# Patient Record
Sex: Female | Born: 2000 | Hispanic: Yes | Marital: Single | Smoking: Never smoker
Health system: Southern US, Community
[De-identification: ages and names within clinical notes are randomized; demographics above are authoritative.]

## PROBLEM LIST (undated history)

## (undated) DIAGNOSIS — J45909 Unspecified asthma, uncomplicated: Secondary | ICD-10-CM

---

## 2019-02-04 ENCOUNTER — Emergency Department (HOSPITAL_COMMUNITY)
Admission: EM | Admit: 2019-02-04 | Discharge: 2019-02-04 | Disposition: A | Discharge status: Home / Self Care | Payer: BLUE CROSS/BLUE SHIELD | Attending: Emergency Medicine | Admitting: Emergency Medicine

## 2019-02-04 ENCOUNTER — Encounter (HOSPITAL_COMMUNITY): Payer: Self-pay | Admitting: *Deleted

## 2019-02-04 DIAGNOSIS — J111 Influenza due to unidentified influenza virus with other respiratory manifestations: Secondary | ICD-10-CM | POA: Insufficient documentation

## 2019-02-04 DIAGNOSIS — J45901 Unspecified asthma with (acute) exacerbation: Secondary | ICD-10-CM | POA: Insufficient documentation

## 2019-02-04 DIAGNOSIS — R0602 Shortness of breath: Secondary | ICD-10-CM | POA: Diagnosis present

## 2019-02-04 HISTORY — DX: Unspecified asthma, uncomplicated: J45.909

## 2019-02-04 MED ORDER — ALBUTEROL SULFATE (2.5 MG/3ML) 0.083% IN NEBU
5.0000 mg | INHALATION_SOLUTION | Freq: Once | RESPIRATORY_TRACT | Status: AC
  Administered 2019-02-04: 5 mg via RESPIRATORY_TRACT
  Filled 2019-02-04: qty 6

## 2019-02-04 MED ORDER — ALBUTEROL SULFATE (2.5 MG/3ML) 0.083% IN NEBU: 2.5000 mg | INHALATION_SOLUTION | Freq: Four times a day (QID) | RESPIRATORY_TRACT | 0 refills | Status: DC | PRN

## 2019-02-04 MED ORDER — PREDNISONE 20 MG PO TABS
60.0000 mg | ORAL_TABLET | Freq: Once | ORAL | Status: AC
  Administered 2019-02-04: 60 mg via ORAL
  Filled 2019-02-04: qty 3

## 2019-02-04 MED ORDER — ONDANSETRON HCL 4 MG PO TABS: 4.0000 mg | ORAL_TABLET | Freq: Three times a day (TID) | ORAL | 0 refills | Status: AC | PRN

## 2019-02-04 MED ORDER — PREDNISONE 50 MG PO TABS: 50.0000 mg | ORAL_TABLET | Freq: Every day | ORAL | 0 refills | Status: AC

## 2019-02-04 MED ORDER — ACETAMINOPHEN 500 MG PO TABS
1000.0000 mg | ORAL_TABLET | Freq: Once | ORAL | Status: AC
  Administered 2019-02-04: 1000 mg via ORAL
  Filled 2019-02-04: qty 2

## 2019-02-04 NOTE — Discharge Instructions (Addendum)
The flu is a virus, which means it gets treated symptomatically as your body clears the illness.  Take Tamiflu as prescribed.  This may cause a lot of nausea.  As such, you may use Zofran as needed for nausea. Take prednisone as prescribed. Use either your inhaler or nebulizer every 4 hours for the next 2 days.  After this, use as needed for wheezing, shortness of breath, or chest tightness. Use Tylenol and ibuprofen as needed for fever and body aches. Make sure you are staying well-hydrated water.  This is very important. Return to the emergency room immediately if you are having increased difficulty breathing not relieved with your inhaler.  Return with any new, worsening, concerning symptoms.

## 2019-02-04 NOTE — ED Provider Notes (Signed)
[facility]-EMERGENCY DEPT Provider Note   CSN: [id number] Arrival date & time: 02/04/19  1645    History   Chief Complaint Chief Complaint  Patient presents with  . Influenza  . Asthma    HPI Erica Collins is a 18 y.o. female presenting for evaluation of flu and asthma exacerbation.  Patient states last night around 4 PM she felt acutely poor.  She reports fever, generalized body aches, nasal congestion, and cough.  Patient went to a [facility] today, tested positive for flu A.  She was given Tamiflu, but she has not been able to fill it yet.  Upon arrival back to [facility], she reports increased shortness of breath and difficulty breathing.  Patient reports a history of asthma, and her symptoms did not improve with her inhaler.  As such, she came to the ER.  She reports one episode of emesis after taking DayQuil, no further nausea.  She has not taken anything else for her symptoms.  She denies ear pain, chest pain, current nausea, abdominal pain, urinary symptoms, abnormal bowel movements.  She has no medical problems other than asthma.  She did not get her flu shot this year.  She denies sick contacts, however is in school.  She lives in an apartment.  She denies recent travel.     HPI  Past Medical History:  Diagnosis Date  . Asthma     There are no active problems to display for this patient.   History reviewed. No pertinent surgical history.   OB History   No obstetric history on file.      Home Medications    Prior to Admission medications   Medication Sig Start Date End Date Taking? Authorizing Provider  albuterol (PROVENTIL) (2.5 MG/3ML) 0.083% nebulizer solution Take 3 mLs (2.5 mg total) by nebulization every 6 (six) hours as needed for wheezing or shortness of breath. 02/04/19   Iseah Plouff, PA-C  ondansetron (ZOFRAN) 4 MG tablet Take 1 tablet (4 mg total) by mouth every 8 (eight) hours as needed. 02/04/19   Neida Ellegood,  Chesnie Capell, PA-C  predniSONE (DELTASONE) 50 MG tablet Take 1 tablet (50 mg total) by mouth daily for 4 days. Starting 3/6 02/04/19 02/08/19  Karee Christopherson, PA-C    Family History No family history on file.  Social History Social History   Tobacco Use  . Smoking status: Never Smoker  . Smokeless tobacco: Never Used  Substance Use Topics  . Alcohol use: Not Currently  . Drug use: Not on file     Allergies   Patient has no allergy information on record.   Review of Systems Review of Systems   Physical Exam Updated Vital Signs BP 99/80   Pulse (!) 108   Temp (!) 100.2 F (39.5 C) (Oral)   Resp 20   Ht 5\' 6"  (1.676 m)   Wt 63 kg   LMP 01/21/2019   SpO2 99%   BMI 22.44 kg/m   Physical Exam Vitals signs and nursing note reviewed.  Constitutional:      General: She is not in acute distress.    Appearance: She is well-developed.     Comments: Appears nontoxic  HENT:     Head: Normocephalic and atraumatic.     Comments: OP mildly erythematous without tonsillar swelling or exudate.  Uvula midline with equal palate rise.  TMs nonerythematous nonbulging bilaterally.  Nasal congestion noted.    Right Ear: Tympanic membrane, ear canal and external ear normal.  Left Ear: Tympanic membrane, ear canal and external ear normal.     Nose: Mucosal edema present.     Right Sinus: No maxillary sinus tenderness or frontal sinus tenderness.     Left Sinus: No maxillary sinus tenderness or frontal sinus tenderness.     Mouth/Throat:     Pharynx: Uvula midline. Posterior oropharyngeal erythema present.     Tonsils: No tonsillar exudate.  Eyes:     Conjunctiva/sclera: Conjunctivae normal.     Pupils: Pupils are equal, round, and reactive to light.  Neck:     Musculoskeletal: Normal range of motion.  Cardiovascular:     Rate and Rhythm: Regular rhythm. Tachycardia present.     Pulses: Normal pulses.     Comments: Mildly tachycardic around 115.  This is after breathing  treatment. Pulmonary:     Effort: Pulmonary effort is normal.     Breath sounds: Normal breath sounds. No decreased breath sounds, wheezing, rhonchi or rales.     Comments: Speaking in full sentences.  Clear lung sounds in all fields.  Cough noted throughout exam. Abdominal:     General: There is no distension.     Palpations: Abdomen is soft. There is no mass.     Tenderness: There is no abdominal tenderness. There is no guarding or rebound.  Musculoskeletal: Normal range of motion.  Lymphadenopathy:     Cervical: No cervical adenopathy.  Skin:    General: Skin is warm.     Capillary Refill: Capillary refill takes less than 2 seconds.  Neurological:     Mental Status: She is alert and oriented to person, place, and time.      ED Treatments / Results  Labs (all labs ordered are listed, but only abnormal results are displayed) Labs Reviewed - No data to display  EKG None  Radiology No results found.  Procedures Procedures (including critical care time)  Medications Ordered in ED Medications  albuterol (PROVENTIL) (2.5 MG/3ML) 0.083% nebulizer solution 5 mg (5 mg Nebulization Given 02/04/19 1717)  acetaminophen (TYLENOL) tablet 1,000 mg (1,000 mg Oral Given 02/04/19 1820)  predniSONE (DELTASONE) tablet 60 mg (60 mg Oral Given 02/04/19 1824)     Initial Impression / Assessment and Plan / ED Course  I have reviewed the triage vital signs and the nursing notes.  Pertinent labs & imaging results that were available during my care of the patient were reviewed by me and considered in my medical decision making (see chart for details).        Patient presenting with increased difficulty breathing after being diagnosed with the flu today.  My physical exam was after patient had been given a breathing treatment.  Per triage note, patient with wheezing upon arrival.  This resolved with the breathing treatment.  Patient mildly febrile and mildly tachycardic on my exam, but appears  nontoxic.  Likely asthma exacerbation due to the flu.  Patient has a nebulizer at home, but no nebulizer medication.  Requesting refill.  Patient given prednisone while waiting in the ER.  Discussed findings with patient and mom.  Discussed continued symptomatic treatment at home, and importance of Tamiflu.  Discussed regular albuterol use, and low threshold for returning if symptoms worsen.  Upon discharge, patient found to be febrile and tachycardic.  Tachycardia likely due to the fever and breathing treatment.  Patient given a gram of Tylenol, offered for her to wait for symptom improvement versus discharge.  Patient would like to be discharged.  At this time, patient  appears safe for discharge. Strict return precautions given. Patient states she understands and agrees plan.  Final Clinical Impressions(s) / ED Diagnoses   Final diagnoses:  Influenza  Exacerbation of asthma, unspecified asthma severity, unspecified whether persistent    ED Discharge Orders         Ordered    predniSONE (DELTASONE) 50 MG tablet  Daily     02/04/19 1821    ondansetron (ZOFRAN) 4 MG tablet  Every 8 hours PRN     02/04/19 1821    albuterol (PROVENTIL) (2.5 MG/3ML) 0.083% nebulizer solution  Every 6 hours PRN     02/04/19 1843           Alveria ApleyCaccavale, Chibuikem Thang, PA-C 02/04/19 2022    Wynetta FinesMessick, Peter C, MD 02/04/19 2024

## 2019-02-04 NOTE — ED Notes (Signed)
Bed: WTR6 Expected date:  Expected time:  Means of arrival:  Comments: HOLD FOR TRIAGE 1

## 2019-02-04 NOTE — ED Triage Notes (Addendum)
Pt was diagnosed with flu this morning. Pt had fever and cough since this morning. Pt has hx of asthma. Pt has prescription for Tamiflu. Pt states breathing is feeling worse. Pt tried her inhaler without relief.

## 2020-11-14 ENCOUNTER — Encounter (HOSPITAL_COMMUNITY): Payer: Self-pay

## 2020-11-14 ENCOUNTER — Emergency Department (HOSPITAL_COMMUNITY)
Admission: EM | Admit: 2020-11-14 | Discharge: 2020-11-14 | Disposition: A | Discharge status: Home / Self Care | Payer: BC Managed Care – PPO | Attending: Emergency Medicine | Admitting: Emergency Medicine

## 2020-11-14 ENCOUNTER — Emergency Department (HOSPITAL_COMMUNITY): Payer: BC Managed Care – PPO

## 2020-11-14 DIAGNOSIS — J4520 Mild intermittent asthma, uncomplicated: Secondary | ICD-10-CM | POA: Diagnosis present

## 2020-11-14 DIAGNOSIS — Z20822 Contact with and (suspected) exposure to covid-19: Secondary | ICD-10-CM | POA: Diagnosis not present

## 2020-11-14 DIAGNOSIS — J069 Acute upper respiratory infection, unspecified: Secondary | ICD-10-CM | POA: Diagnosis not present

## 2020-11-14 DIAGNOSIS — R0981 Nasal congestion: Secondary | ICD-10-CM | POA: Diagnosis not present

## 2020-11-14 DIAGNOSIS — Z79899 Other long term (current) drug therapy: Secondary | ICD-10-CM | POA: Diagnosis not present

## 2020-11-14 DIAGNOSIS — R0602 Shortness of breath: Secondary | ICD-10-CM | POA: Diagnosis not present

## 2020-11-14 DIAGNOSIS — R059 Cough, unspecified: Secondary | ICD-10-CM | POA: Diagnosis not present

## 2020-11-14 LAB — RESP PANEL BY RT-PCR (RSV, FLU A&B, COVID)  RVPGX2
Influenza A by PCR: NEGATIVE
Influenza B by PCR: NEGATIVE
Resp Syncytial Virus by PCR: NEGATIVE
SARS Coronavirus 2 by RT PCR: NEGATIVE

## 2020-11-14 MED ORDER — ALBUTEROL SULFATE (2.5 MG/3ML) 0.083% IN NEBU: 2.5000 mg | INHALATION_SOLUTION | Freq: Four times a day (QID) | RESPIRATORY_TRACT | 2 refills | Status: DC | PRN

## 2020-11-14 MED ORDER — AEROCHAMBER Z-STAT PLUS/MEDIUM MISC
1.0000 | Freq: Once | Status: AC
  Administered 2020-11-14: 1
  Filled 2020-11-14: qty 1

## 2020-11-14 MED ORDER — IPRATROPIUM-ALBUTEROL 0.5-2.5 (3) MG/3ML IN SOLN
3.0000 mL | RESPIRATORY_TRACT | Status: AC
  Administered 2020-11-14 (×3): 3 mL via RESPIRATORY_TRACT
  Filled 2020-11-14: qty 9

## 2020-11-14 MED ORDER — PREDNISONE 10 MG PO TABS: 40.0000 mg | ORAL_TABLET | Freq: Every day | ORAL | 0 refills | Status: AC

## 2020-11-14 MED ORDER — ALBUTEROL SULFATE HFA 108 (90 BASE) MCG/ACT IN AERS
4.0000 | INHALATION_SPRAY | Freq: Once | RESPIRATORY_TRACT | Status: AC
  Administered 2020-11-14: 4 via RESPIRATORY_TRACT
  Filled 2020-11-14: qty 6.7

## 2020-11-14 MED ORDER — PREDNISONE 20 MG PO TABS
40.0000 mg | ORAL_TABLET | Freq: Once | ORAL | Status: AC
  Administered 2020-11-14: 40 mg via ORAL
  Filled 2020-11-14: qty 2

## 2020-11-14 NOTE — ED Triage Notes (Signed)
Pt presents with c/o asthma and nasal congestion. Pt reports she feels like she has a cold. Pt reports she has been using her nebulizer with minimal relief. Pt alert and oriented, no distress noted.

## 2020-11-14 NOTE — ED Provider Notes (Signed)
[facility]-EMERGENCY DEPT Provider Note   CSN: [id number] Arrival date & time: 11/14/20  1021     History Chief Complaint  Patient presents with  . Nasal Congestion  . Asthma  . Cough    Erica Collins is a 19 y.o. female.   Asthma This is a recurrent problem. The current episode started 2 days ago. The problem occurs constantly. The problem has not changed since onset.Associated symptoms include shortness of breath. Pertinent negatives include no chest pain and no abdominal pain. Nothing aggravates the symptoms. Treatments tried: albuterol. The treatment provided mild relief.  Cough Associated symptoms: rhinorrhea and shortness of breath   Associated symptoms: no chest pain, no chills, no ear pain, no fever, no rash and no sore throat        Past Medical History:  Diagnosis Date  . Asthma     There are no problems to display for this patient.   History reviewed. No pertinent surgical history.   OB History   No obstetric history on file.     History reviewed. No pertinent family history.  Social History   Tobacco Use  . Smoking status: Never Smoker  . Smokeless tobacco: Never Used  Substance Use Topics  . Alcohol use: Not Currently    Home Medications Prior to Admission medications   Medication Sig Start Date End Date Taking? Authorizing Provider  albuterol (PROVENTIL) (2.5 MG/3ML) 0.083% nebulizer solution Take 3 mLs (2.5 mg total) by nebulization every 6 (six) hours as needed for wheezing or shortness of breath. 11/14/20   Sabino Donovan, MD  ondansetron (ZOFRAN) 4 MG tablet Take 1 tablet (4 mg total) by mouth every 8 (eight) hours as needed. 02/04/19   Caccavale, Sophia, PA-C  predniSONE (DELTASONE) 10 MG tablet Take 4 tablets (40 mg total) by mouth daily with breakfast for 4 days. 11/14/20 11/18/20  Sabino Donovan, MD    Allergies    Patient has no known allergies.  Review of Systems   Review of Systems  Constitutional:  Negative for chills and fever.  HENT: Positive for congestion and rhinorrhea. Negative for ear pain and sore throat.   Eyes: Negative for pain and visual disturbance.  Respiratory: Positive for cough, chest tightness and shortness of breath.   Cardiovascular: Negative for chest pain and palpitations.  Gastrointestinal: Negative for abdominal pain and vomiting.  Genitourinary: Negative for dysuria and hematuria.  Musculoskeletal: Negative for arthralgias and back pain.  Skin: Negative for color change and rash.  Neurological: Negative for seizures and syncope.  All other systems reviewed and are negative.   Physical Exam Updated Vital Signs BP 133/70 (BP Location: Left Arm)   Pulse 87   Temp 98.7 F (37.1 C) (Oral)   Resp 16   LMP 10/24/2020 (Approximate)   SpO2 98%   Physical Exam Vitals and nursing note reviewed. Exam conducted with a chaperone present.  Constitutional:      General: She is not in acute distress.    Appearance: Normal appearance.  HENT:     Head: Normocephalic and atraumatic.     Nose: No rhinorrhea.  Eyes:     General:        Right eye: No discharge.        Left eye: No discharge.     Conjunctiva/sclera: Conjunctivae normal.  Cardiovascular:     Rate and Rhythm: Normal rate and regular rhythm.  Pulmonary:     Effort: Pulmonary effort is normal. No respiratory distress.  Breath sounds: No stridor. Wheezing present.  Abdominal:     General: Abdomen is flat. There is no distension.     Palpations: Abdomen is soft.  Musculoskeletal:        General: No tenderness or signs of injury.  Skin:    General: Skin is warm and dry.  Neurological:     General: No focal deficit present.     Mental Status: She is alert. Mental status is at baseline.     Motor: No weakness.  Psychiatric:        Mood and Affect: Mood normal.        Behavior: Behavior normal.     ED Results / Procedures / Treatments   Labs (all labs ordered are listed, but only abnormal  results are displayed) Labs Reviewed  RESP PANEL BY RT-PCR (RSV, FLU A&B, COVID)  [id number]    EKG None  Radiology DG Chest 2 View  Result Date: 11/14/2020 CLINICAL DATA:  Cough. Additional provided: Patient reports history of asthma, nasal congestion. EXAM: CHEST - 2 VIEW COMPARISON:  No pertinent prior exams available for comparison. FINDINGS: Heart size within normal limits. No appreciable airspace consolidation. No evidence of pleural effusion or pneumothorax. No acute bony abnormality identified. IMPRESSION: No evidence of active cardiopulmonary disease. Electronically Signed   By: Jackey Loge DO   On: 11/14/2020 12:40    Procedures Procedures (including critical care time)  Medications Ordered in ED Medications  albuterol (VENTOLIN HFA) 108 (90 Base) MCG/ACT inhaler 4 puff (has no administration in time range)  aerochamber Z-Stat Plus/medium 1 each (has no administration in time range)  ipratropium-albuterol (DUONEB) 0.5-2.5 (3) MG/3ML nebulizer solution 3 mL (3 mLs Nebulization Given 11/14/20 1353)  predniSONE (DELTASONE) tablet 40 mg (40 mg Oral Given 11/14/20 1328)    ED Course  I have reviewed the triage vital signs and the nursing notes.  Pertinent labs & imaging results that were available during my care of the patient were reviewed by me and considered in my medical decision making (see chart for details).    MDM Rules/Calculators/A&P                          Patient with history of asthma comes in with chest tightness similar to previous asthma exacerbations likely in the setting of URI.  Hemodynamically stable.  Labs reviewed by me show no Covid or other viral illness that we test for, chest x-ray reviewed by radiology myself shows no acute cardiopulmonary pathology.  Patient will get breathing treatment steroids.  Patient has complete resolution of symptoms, has clear lungs on reauscultation, safe for discharge home outpatient follow-up, MDI given refill of  albuterol given prednisone prescription given return precautions discussed Final Clinical Impression(s) / ED Diagnoses Final diagnoses:  Upper respiratory tract infection, unspecified type  Mild intermittent asthma, unspecified whether complicated    Rx / DC Orders ED Discharge Orders         Ordered    predniSONE (DELTASONE) 10 MG tablet  Daily with breakfast        11/14/20 1423    albuterol (PROVENTIL) (2.5 MG/3ML) 0.083% nebulizer solution  Every 6 hours PRN        11/14/20 1423           Sabino Donovan, MD 11/14/20 1423

## 2021-08-01 ENCOUNTER — Encounter (HOSPITAL_COMMUNITY): Payer: Self-pay | Admitting: *Deleted

## 2021-08-01 ENCOUNTER — Emergency Department (HOSPITAL_COMMUNITY)
Admission: EM | Admit: 2021-08-01 | Discharge: 2021-08-01 | Disposition: A | Discharge status: Home / Self Care | Payer: BC Managed Care – PPO | Attending: Emergency Medicine | Admitting: Emergency Medicine

## 2021-08-01 ENCOUNTER — Other Ambulatory Visit: Payer: Self-pay

## 2021-08-01 ENCOUNTER — Emergency Department (HOSPITAL_COMMUNITY): Payer: BC Managed Care – PPO

## 2021-08-01 DIAGNOSIS — J45901 Unspecified asthma with (acute) exacerbation: Secondary | ICD-10-CM | POA: Insufficient documentation

## 2021-08-01 DIAGNOSIS — R0602 Shortness of breath: Secondary | ICD-10-CM | POA: Diagnosis present

## 2021-08-01 DIAGNOSIS — Z20822 Contact with and (suspected) exposure to covid-19: Secondary | ICD-10-CM | POA: Diagnosis not present

## 2021-08-01 LAB — RESP PANEL BY RT-PCR (FLU A&B, COVID) ARPGX2
Influenza A by PCR: NEGATIVE
Influenza B by PCR: NEGATIVE
SARS Coronavirus 2 by RT PCR: NEGATIVE

## 2021-08-01 MED ORDER — ALBUTEROL SULFATE (2.5 MG/3ML) 0.083% IN NEBU
5.0000 mg | INHALATION_SOLUTION | Freq: Once | RESPIRATORY_TRACT | Status: AC
  Administered 2021-08-01: 5 mg via RESPIRATORY_TRACT
  Filled 2021-08-01: qty 6

## 2021-08-01 MED ORDER — PREDNISONE 20 MG PO TABS
20.0000 mg | ORAL_TABLET | Freq: Once | ORAL | Status: AC
  Administered 2021-08-01: 20 mg via ORAL
  Filled 2021-08-01: qty 1

## 2021-08-01 MED ORDER — ALBUTEROL SULFATE (2.5 MG/3ML) 0.083% IN NEBU: 2.5000 mg | INHALATION_SOLUTION | Freq: Four times a day (QID) | RESPIRATORY_TRACT | 2 refills | Status: AC | PRN

## 2021-08-01 MED ORDER — ALBUTEROL SULFATE (2.5 MG/3ML) 0.083% IN NEBU: 2.5000 mg | INHALATION_SOLUTION | Freq: Four times a day (QID) | RESPIRATORY_TRACT | 2 refills | Status: DC | PRN

## 2021-08-01 MED ORDER — PREDNISONE 10 MG PO TABS: 20.0000 mg | ORAL_TABLET | Freq: Every day | ORAL | 0 refills | Status: DC

## 2021-08-01 NOTE — ED Provider Notes (Signed)
[facility]-EMERGENCY DEPT Provider Note   CSN: [id number] Arrival date & time: 08/01/21  1400     History Chief Complaint  Patient presents with   Shortness of Breath    Erica Collins is a 20 y.o. female.  HPI  20 year old female past medical history of asthma presents emergency department with shortness of breath and wheezing.  Patient states about a week ago she was treated for sinusitis with antibiotic and steroids.  She improved however a couple days ago started having worsening wheezing not resolved with her albuterol inhaler at home.  Denies any fevers.  No ongoing chest pain or lower extremity swelling.  Past Medical History:  Diagnosis Date   Asthma     There are no problems to display for this patient.   History reviewed. No pertinent surgical history.   OB History   No obstetric history on file.     No family history on file.  Social History   Tobacco Use   Smoking status: Never   Smokeless tobacco: Never  Substance Use Topics   Alcohol use: Not Currently    Home Medications Prior to Admission medications   Medication Sig Start Date End Date Taking? Authorizing Provider  albuterol (PROVENTIL) (2.5 MG/3ML) 0.083% nebulizer solution Take 3 mLs (2.5 mg total) by nebulization every 6 (six) hours as needed for wheezing or shortness of breath. 11/14/20   Sabino Donovan, MD  ondansetron (ZOFRAN) 4 MG tablet Take 1 tablet (4 mg total) by mouth every 8 (eight) hours as needed. 02/04/19   Caccavale, Sophia, PA-C    Allergies    Patient has no known allergies.  Review of Systems   Review of Systems  Constitutional:  Negative for chills and fever.  HENT:  Negative for congestion.   Eyes:  Negative for visual disturbance.  Respiratory:  Positive for shortness of breath and wheezing.   Cardiovascular:  Negative for chest pain, palpitations and leg swelling.  Gastrointestinal:  Negative for abdominal pain, diarrhea and vomiting.   Genitourinary:  Negative for dysuria.  Skin:  Negative for rash.  Neurological:  Negative for headaches.   Physical Exam Updated Vital Signs BP (!) 132/92   Pulse 71   Temp 98.2 F (36.8 C) (Oral)   Resp 18   LMP 07/06/2021   SpO2 99%   Physical Exam Vitals and nursing note reviewed.  Constitutional:      General: She is not in acute distress.    Appearance: Normal appearance. She is not diaphoretic.  HENT:     Head: Normocephalic.     Mouth/Throat:     Mouth: Mucous membranes are moist.  Cardiovascular:     Rate and Rhythm: Normal rate.  Pulmonary:     Effort: Pulmonary effort is normal. No tachypnea, accessory muscle usage or respiratory distress.     Breath sounds: Wheezing present. No rales.  Abdominal:     Palpations: Abdomen is soft.     Tenderness: There is no abdominal tenderness.  Skin:    General: Skin is warm.  Neurological:     Mental Status: She is alert and oriented to person, place, and time. Mental status is at baseline.  Psychiatric:        Mood and Affect: Mood normal.    ED Results / Procedures / Treatments   Labs (all labs ordered are listed, but only abnormal results are displayed) Labs Reviewed  RESP PANEL BY RT-PCR (FLU A&B, COVID) [id number]    EKG None  Radiology DG Chest 2 View  Result Date: 08/01/2021 CLINICAL DATA:  Shortness of breath EXAM: CHEST - 2 VIEW COMPARISON:  December 2021 FINDINGS: The heart size and mediastinal contours are within normal limits. Both lungs are clear. No pleural effusion. The visualized skeletal structures are unremarkable. IMPRESSION: No active cardiopulmonary disease. Electronically Signed   By: Guadlupe Spanish M.D.   On: 08/01/2021 15:02    Procedures Procedures   Medications Ordered in ED Medications  albuterol (PROVENTIL) (2.5 MG/3ML) 0.083% nebulizer solution 5 mg (has no administration in time range)  predniSONE (DELTASONE) tablet 20 mg (has no administration in time range)    ED Course  I have  reviewed the triage vital signs and the nursing notes.  Pertinent labs & imaging results that were available during my care of the patient were reviewed by me and considered in my medical decision making (see chart for details).    MDM Rules/Calculators/A&P                           20 year old female presents emergency department with shortness of breath and wheezing.  Vitals are stable.  Diffuse wheezing bilaterally.  Flu and COVID-negative.  Improved with nebulizer and steroids.  On lung auscultation wheezing has resolved, patient feels significantly improved, has been ambulatory without any symptoms.  Low suspicion for PE, PERC negative.  Chest x-ray is negative for pneumonia or other acute findings.  We will treat as asthma exacerbation and discharge.  Patient at this time appears safe and stable for discharge and will be treated as an outpatient.  Discharge plan and strict return to ED precautions discussed, patient verbalizes understanding and agreement.  Final Clinical Impression(s) / ED Diagnoses Final diagnoses:  None    Rx / DC Orders ED Discharge Orders     None        Rozelle Logan, DO 08/01/21 2157

## 2021-08-01 NOTE — Discharge Instructions (Addendum)
You have been seen and discharged from the emergency department.  Use your albuterol nebulizer as prescribed.  Take steroids as directed.  Follow-up with your primary provider for reevaluation and further care. Take home medications as prescribed. If you have any worsening symptoms or further concerns for your health please return to an emergency department for further evaluation.

## 2021-08-01 NOTE — ED Provider Notes (Signed)
Emergency Medicine Provider Triage Evaluation Note  Erica Collins , a 20 y.o. female  was evaluated in triage.  Pt complains of sob. Asthma hx, recently completed prednisone and amoxicillin. Primary care wants CXR to evaluated walking PNA.  Review of Systems  Positive: SOB, chest tightness when albuterol wears off Negative: Fevers, chills  Physical Exam  BP (!) 130/104 (BP Location: Right Arm)   Pulse 90   Temp 98.8 F (37.1 C) (Oral)   Resp 18   LMP 07/06/2021   SpO2 99%  Gen:   Awake, no distress   Resp:  Normal effort  MSK:   Moves extremities without difficulty  Other:    Medical Decision Making  Medically screening exam initiated at 2:28 PM.  Appropriate orders placed.  Erica Collins was informed that the remainder of the evaluation will be completed by another provider, this initial triage assessment does not replace that evaluation, and the importance of remaining in the ED until their evaluation is complete.     Woodroe Chen 08/01/21 1430    Cheryll Cockayne, MD 08/06/21 [phone]

## 2021-08-01 NOTE — ED Triage Notes (Signed)
Pt sent here for chest x-ray for possible PNA. She was treated with amoxicillin and prednisone. Completed treatment, still has to use inhaler, feels short of breath and is coughing.

## 2021-08-21 ENCOUNTER — Emergency Department (HOSPITAL_COMMUNITY)
Admission: EM | Admit: 2021-08-21 | Discharge: 2021-08-21 | Disposition: A | Discharge status: Home / Self Care | Payer: BC Managed Care – PPO | Attending: Emergency Medicine | Admitting: Emergency Medicine

## 2021-08-21 ENCOUNTER — Encounter (HOSPITAL_COMMUNITY): Payer: Self-pay | Admitting: Emergency Medicine

## 2021-08-21 ENCOUNTER — Emergency Department (HOSPITAL_COMMUNITY): Payer: BC Managed Care – PPO

## 2021-08-21 DIAGNOSIS — R0602 Shortness of breath: Secondary | ICD-10-CM | POA: Diagnosis present

## 2021-08-21 DIAGNOSIS — J4541 Moderate persistent asthma with (acute) exacerbation: Secondary | ICD-10-CM | POA: Insufficient documentation

## 2021-08-21 DIAGNOSIS — Z7951 Long term (current) use of inhaled steroids: Secondary | ICD-10-CM | POA: Insufficient documentation

## 2021-08-21 LAB — CBC WITH DIFFERENTIAL/PLATELET
Abs Immature Granulocytes: 0.01 10*3/uL (ref 0.00–0.07)
Basophils Absolute: 0.1 10*3/uL (ref 0.0–0.1)
Basophils Relative: 2 %
Eosinophils Absolute: 1.1 10*3/uL — ABNORMAL HIGH (ref 0.0–0.5)
Eosinophils Relative: 14 %
HCT: 41.1 % (ref 36.0–46.0)
Hemoglobin: 13.7 g/dL (ref 12.0–15.0)
Immature Granulocytes: 0 %
Lymphocytes Relative: 25 %
Lymphs Abs: 2 10*3/uL (ref 0.7–4.0)
MCH: 30.2 pg (ref 26.0–34.0)
MCHC: 33.3 g/dL (ref 30.0–36.0)
MCV: 90.7 fL (ref 80.0–100.0)
Monocytes Absolute: 0.6 10*3/uL (ref 0.1–1.0)
Monocytes Relative: 7 %
Neutro Abs: 4.2 10*3/uL (ref 1.7–7.7)
Neutrophils Relative %: 52 %
Platelets: 372 10*3/uL (ref 150–400)
RBC: 4.53 MIL/uL (ref 3.87–5.11)
RDW: 12.8 % (ref 11.5–15.5)
WBC: 8.1 10*3/uL (ref 4.0–10.5)
nRBC: 0 % (ref 0.0–0.2)

## 2021-08-21 LAB — COMPREHENSIVE METABOLIC PANEL
ALT: 24 U/L (ref 0–44)
AST: 27 U/L (ref 15–41)
Albumin: 4.4 g/dL (ref 3.5–5.0)
Alkaline Phosphatase: 61 U/L (ref 38–126)
Anion gap: 8 (ref 5–15)
BUN: 12 mg/dL (ref 6–20)
CO2: 22 mmol/L (ref 22–32)
Calcium: 9.8 mg/dL (ref 8.9–10.3)
Chloride: 111 mmol/L (ref 98–111)
Creatinine, Ser: 0.76 mg/dL (ref 0.44–1.00)
GFR, Estimated: >60 mL/min (ref >60)
Glucose, Bld: 97 mg/dL (ref 70–99)
Potassium: 4.2 mmol/L (ref 3.5–5.1)
Sodium: 141 mmol/L (ref 135–145)
Total Bilirubin: 0.6 mg/dL (ref 0.3–1.2)
Total Protein: 7.7 g/dL (ref 6.5–8.1)

## 2021-08-21 LAB — D-DIMER, QUANTITATIVE: D-Dimer, Quant: 0.3 ug/mL-FEU (ref 0.00–0.50)

## 2021-08-21 LAB — TROPONIN I (HIGH SENSITIVITY): Troponin I (High Sensitivity): <2 ng/L (ref <18)

## 2021-08-21 MED ORDER — PULMICORT FLEXHALER 90 MCG/ACT IN AEPB: 2.0000 | INHALATION_SPRAY | Freq: Two times a day (BID) | RESPIRATORY_TRACT | 1 refills | Status: AC

## 2021-08-21 MED ORDER — PREDNISONE 20 MG PO TABS
60.0000 mg | ORAL_TABLET | Freq: Once | ORAL | Status: AC
  Administered 2021-08-21: 60 mg via ORAL
  Filled 2021-08-21: qty 3

## 2021-08-21 MED ORDER — PREDNISONE 20 MG PO TABS
ORAL_TABLET | ORAL | 0 refills | Status: DC
Start: 2021-08-21 — End: 2024-03-14

## 2021-08-21 MED ORDER — ALBUTEROL SULFATE HFA 108 (90 BASE) MCG/ACT IN AERS: 2.0000 | INHALATION_SPRAY | RESPIRATORY_TRACT | 1 refills | Status: DC | PRN

## 2021-08-21 MED ORDER — IPRATROPIUM-ALBUTEROL 0.5-2.5 (3) MG/3ML IN SOLN
3.0000 mL | Freq: Once | RESPIRATORY_TRACT | Status: AC
  Administered 2021-08-21: 3 mL via RESPIRATORY_TRACT
  Filled 2021-08-21: qty 3

## 2021-08-21 MED ORDER — ALBUTEROL SULFATE (5 MG/ML) 0.5% IN NEBU: 2.5000 mg | INHALATION_SOLUTION | Freq: Four times a day (QID) | RESPIRATORY_TRACT | 2 refills | Status: AC | PRN

## 2021-08-21 NOTE — ED Provider Notes (Signed)
Emergency Medicine Provider Triage Evaluation Note  Erica Collins , a 20 y.o. female  was evaluated in triage.  Pt complains of shortness of breath for a month.  She has a history of asthma however has never had a exacerbation last this long before.  She states that she had negative COVID test when this all started. Unable to apply PERC criteria as she takes hormone-based birth control.    Review of Systems  Positive: Shortness of breath, cough, wheezing Negative: fever  Physical Exam  BP 134/90 (BP Location: Right Arm)   Pulse 74   Temp 98.3 F (36.8 C) (Oral)   Resp 16   Ht 5\' 6"  (1.676 m)   Wt 77.1 kg   LMP 08/07/2021 (Approximate)   SpO2 98%   BMI 27.44 kg/m  Gen:   Awake, no distress   Resp:  Normal effort  MSK:   Moves extremities without difficulty  Other:  Diffuse bilateral wheezes.    Medical Decision Making  Medically screening exam initiated at 1:00 PM.  Appropriate orders placed.  Erica Collins was informed that the remainder of the evaluation will be completed by another provider, this initial triage assessment does not replace that evaluation, and the importance of remaining in the ED until their evaluation is complete.  As patient takes hormone-based birth control with ongoing shortness of breath that is outside of her usual asthma presentation we will obtain a D-dimer.  Additionally as I do not see she has had blood work drawn will obtain labs to check for electrolyte imbalances, anemia, and others.  Her chest x-ray is reassuring.  Given that she has been on multiple rounds of p.o. steroids without resolution I feel additional work-up is a warranted. She is not on any maintenance steroid inhales.       10/07/2021 08/21/21 1310    08/23/21, MD 08/21/21 [phone]

## 2021-08-21 NOTE — Discharge Instructions (Signed)
1.  Continue your prednisone starting tomorrow.  Your first dose was given in the emergency department. 2.  You may start taking Pulmicort for your baseline control of asthma.  Take 2 puffs twice daily. 3.  You have been given a prescription for an albuterol inhaler as well as solution for your nebulizer.  Use the albuterol every 4 hours as needed for wheezing and coughing.  Once your symptoms are improved, continue the Pulmicort and start using less of your albuterol. 4.  Get scheduled with a pulmonologist for evaluation of worsening asthma.  Return to emergency department immediately if your symptoms are worsening and not improving to treatment.

## 2021-08-21 NOTE — ED Triage Notes (Signed)
Pt states she has had SOB x 1/5 months. Hx of asthma but as soon as she gets off of her steroids she can't breathe again. Chest pain today. Took nebulizer. Alert and oriented.

## 2021-08-21 NOTE — ED Provider Notes (Signed)
[facility]-EMERGENCY DEPT Provider Note   CSN: [id number] Arrival date & time: 08/21/21  0847     History Chief Complaint  Patient presents with   Shortness of Breath    Erica Collins is a 20 y.o. female.  HPI Patient reports she has had asthma since childhood.  She reports typically her symptoms are controlled with an albuterol inhaler or nebulizer as needed.  Usually she does not have to use it more than a few times per month.  Lately however if she is having increasing frequency of attacks and having to use her albuterol several times a day every day.  This has been going on for about a month now.  Patient reports initially she was treated with a prednisone taper and symptoms improved but they have come back when the prednisone was finished and continue to persist and worsen.  Patient reports she has some coughing but has not had any fever and cough is nonproductive.  She was treated with amoxicillin at the beginning of this symptom elevation and there was no improvement.  She has not had any fever or chest pain.  No vomiting.  He has not had much nasal congestion sore throat or drainage.  No swelling of the legs or calf pain.  Patient reports that she thinks she may have environmental exposure to mold in her apartment.  It is being evaluated.  She does feel that her symptoms are improved when she is not in her apartment.    Past Medical History:  Diagnosis Date   Asthma     There are no problems to display for this patient.   History reviewed. No pertinent surgical history.   OB History   No obstetric history on file.     History reviewed. No pertinent family history.  Social History   Tobacco Use   Smoking status: Never   Smokeless tobacco: Never  Substance Use Topics   Alcohol use: Not Currently    Home Medications Prior to Admission medications   Medication Sig Start Date End Date Taking? Authorizing Provider  albuterol  (PROVENTIL) (5 MG/ML) 0.5% nebulizer solution Take 0.5 mLs (2.5 mg total) by nebulization every 6 (six) hours as needed for wheezing or shortness of breath. 08/21/21  Yes Arby Barrette, MD  albuterol (VENTOLIN HFA) 108 (90 Base) MCG/ACT inhaler Inhale 2 puffs into the lungs every 4 (four) hours as needed for wheezing or shortness of breath. 08/21/21  Yes Dale Ribeiro, Lebron Conners, MD  Budesonide (PULMICORT FLEXHALER) 90 MCG/ACT inhaler Inhale 2 puffs into the lungs 2 (two) times daily. 08/21/21  Yes Arby Barrette, MD  predniSONE (DELTASONE) 20 MG tablet 2 tabs po daily x 4 days 08/21/21  Yes Calyb Mcquarrie, Lebron Conners, MD  albuterol (PROVENTIL) (2.5 MG/3ML) 0.083% nebulizer solution Take 3 mLs (2.5 mg total) by nebulization every 6 (six) hours as needed for wheezing or shortness of breath. 08/01/21   Horton, Clabe Seal, DO  ondansetron (ZOFRAN) 4 MG tablet Take 1 tablet (4 mg total) by mouth every 8 (eight) hours as needed. 02/04/19   Caccavale, Sophia, PA-C  predniSONE (DELTASONE) 10 MG tablet Take 2 tablets (20 mg total) by mouth daily. Take 2 tablets (20 mg) by mouth daily for 5 days. Then take 1 tablet (10 mg) by mouth daily for 5 days. 08/01/21   Horton, Clabe Seal, DO    Allergies    Patient has no known allergies.  Review of Systems   Review of Systems 10 systems reviewed and negative  except as per HPI Physical Exam Updated Vital Signs BP (!) 150/93 (BP Location: Right Arm)   Pulse 85   Temp 97.8 F (36.6 C) (Oral)   Resp 16   Ht 5\' 6"  (1.676 m)   Wt 77.1 kg   LMP 08/07/2021 (Approximate)   SpO2 97%   BMI 27.44 kg/m   Physical Exam Constitutional:      General: She is not in acute distress.    Appearance: Normal appearance.  HENT:     Mouth/Throat:     Mouth: Mucous membranes are moist.     Pharynx: Oropharynx is clear.  Eyes:     Extraocular Movements: Extraocular movements intact.     Conjunctiva/sclera: Conjunctivae normal.  Cardiovascular:     Rate and Rhythm: Normal rate and regular rhythm.   Pulmonary:     Comments: No respiratory distress.  Patient does have diffuse wheezing bilaterally to the mid lung fields.  She does have cough with deep inspiration. Abdominal:     General: There is no distension.     Palpations: Abdomen is soft.     Tenderness: There is no abdominal tenderness. There is no guarding.  Musculoskeletal:        General: No swelling or tenderness. Normal range of motion.     Right lower leg: No edema.     Left lower leg: No edema.  Skin:    General: Skin is warm and dry.  Neurological:     General: No focal deficit present.     Mental Status: She is alert and oriented to person, place, and time.     Coordination: Coordination normal.  Psychiatric:        Mood and Affect: Mood normal.    ED Results / Procedures / Treatments   Labs (all labs ordered are listed, but only abnormal results are displayed) Labs Reviewed  CBC WITH DIFFERENTIAL/PLATELET - Abnormal; Notable for the following components:      Result Value   Eosinophils Absolute 1.1 (*)    All other components within normal limits  COMPREHENSIVE METABOLIC PANEL  D-DIMER, QUANTITATIVE  POC URINE PREG, ED  TROPONIN I (HIGH SENSITIVITY)  TROPONIN I (HIGH SENSITIVITY)    EKG EKG Interpretation  Date/Time:  Tuesday August 21 2021 08:57:54 EDT Ventricular Rate:  91 PR Interval:  136 QRS Duration: 79 QT Interval:  355 QTC Calculation: 437 R Axis:   32 Text Interpretation: Sinus rhythm normal, no old comparison Confirmed by [id number] [phone]) on 08/21/2021 5:03:53 PM  Radiology DG Chest 2 View  Result Date: 08/21/2021 CLINICAL DATA:  20 year old female with history of shortness of breath for 1 month. Chest pain today. EXAM: CHEST - 2 VIEW COMPARISON:  Chest x-ray 08/01/2021. FINDINGS: Lung volumes are normal. No consolidative airspace disease. No pleural effusions. No pneumothorax. No pulmonary nodule or mass noted. Pulmonary vasculature and the cardiomediastinal silhouette are  within normal limits. IMPRESSION: No radiographic evidence of acute cardiopulmonary disease. Electronically Signed   By: 08/03/2021 M.D.   On: 08/21/2021 09:35    Procedures Procedures   Medications Ordered in ED Medications  ipratropium-albuterol (DUONEB) 0.5-2.5 (3) MG/3ML nebulizer solution 3 mL (3 mLs Nebulization Given 08/21/21 1737)  predniSONE (DELTASONE) tablet 60 mg (60 mg Oral Given 08/21/21 1736)    ED Course  I have reviewed the triage vital signs and the nursing notes.  Pertinent labs & imaging results that were available during my care of the patient were reviewed by me and considered in my  medical decision making (see chart for details).    MDM Rules/Calculators/A&P                           Patient describes mild intermittent asthma since childhood.  She reports over the past several months that this has been worsening and she is having more consistent episodes of wheezing and coughing with shortness of breath.  She reports over the past month its become almost a daily problem.  Patient does not smoke.  She does not describe acute infectious symptoms.  She was empirically treated with amoxicillin at the onset of this event.  She opines she may have an ongoing environmental exposure in her apartment.  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  Patient is actively wheezing at time of examination.  At this time with over a month of recurrent and near daily symptoms requiring frequent albuterol use, will use a prednisone burst and start patient on daily maintenance therapy with Pulmicort.  Patient is advised to have close follow-up with pulmonology for further evaluation of worsening asthma as a young adult. Final Clinical Impression(s) / ED Diagnoses Final diagnoses:  Moderate persistent asthma with exacerbation    Rx / DC Orders ED Discharge Orders          Ordered    Budesonide (PULMICORT FLEXHALER) 90 MCG/ACT inhaler  2 times daily        08/21/21 1750    albuterol  (VENTOLIN HFA) 108 (90 Base) MCG/ACT inhaler  Every 4 hours PRN        08/21/21 1750    albuterol (PROVENTIL) (5 MG/ML) 0.5% nebulizer solution  Every 6 hours PRN        08/21/21 1750    predniSONE (DELTASONE) 20 MG tablet        08/21/21 1750             Arby Barrette, MD 08/21/21 1813

## 2021-12-04 IMAGING — CR DG CHEST 2V
2 series · 2 of 2 positions shown · non-contrast
Comparison: November 2020

CLINICAL DATA: Shortness of breath

EXAM:
CHEST - 2 VIEW

[w chest pa]
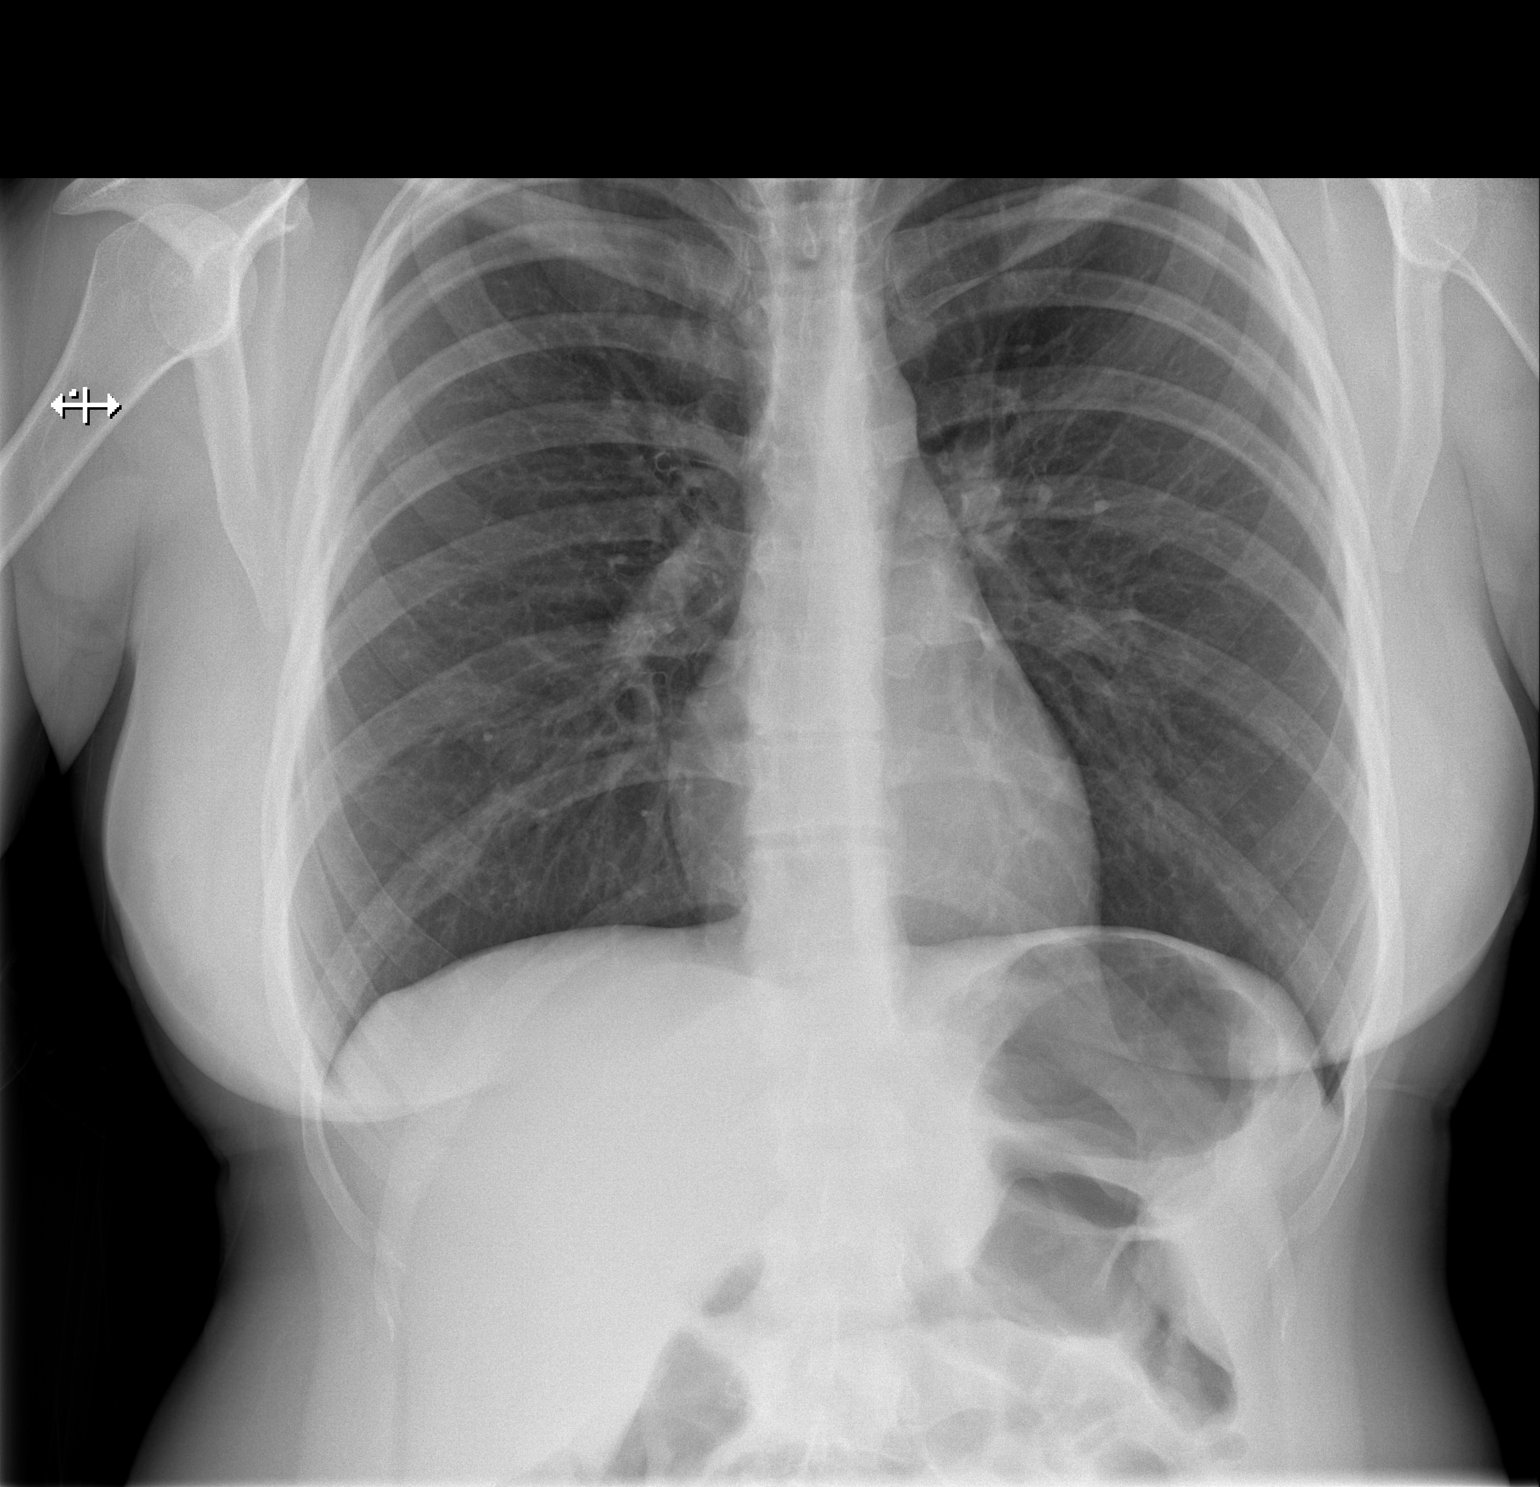

[w chest lat]
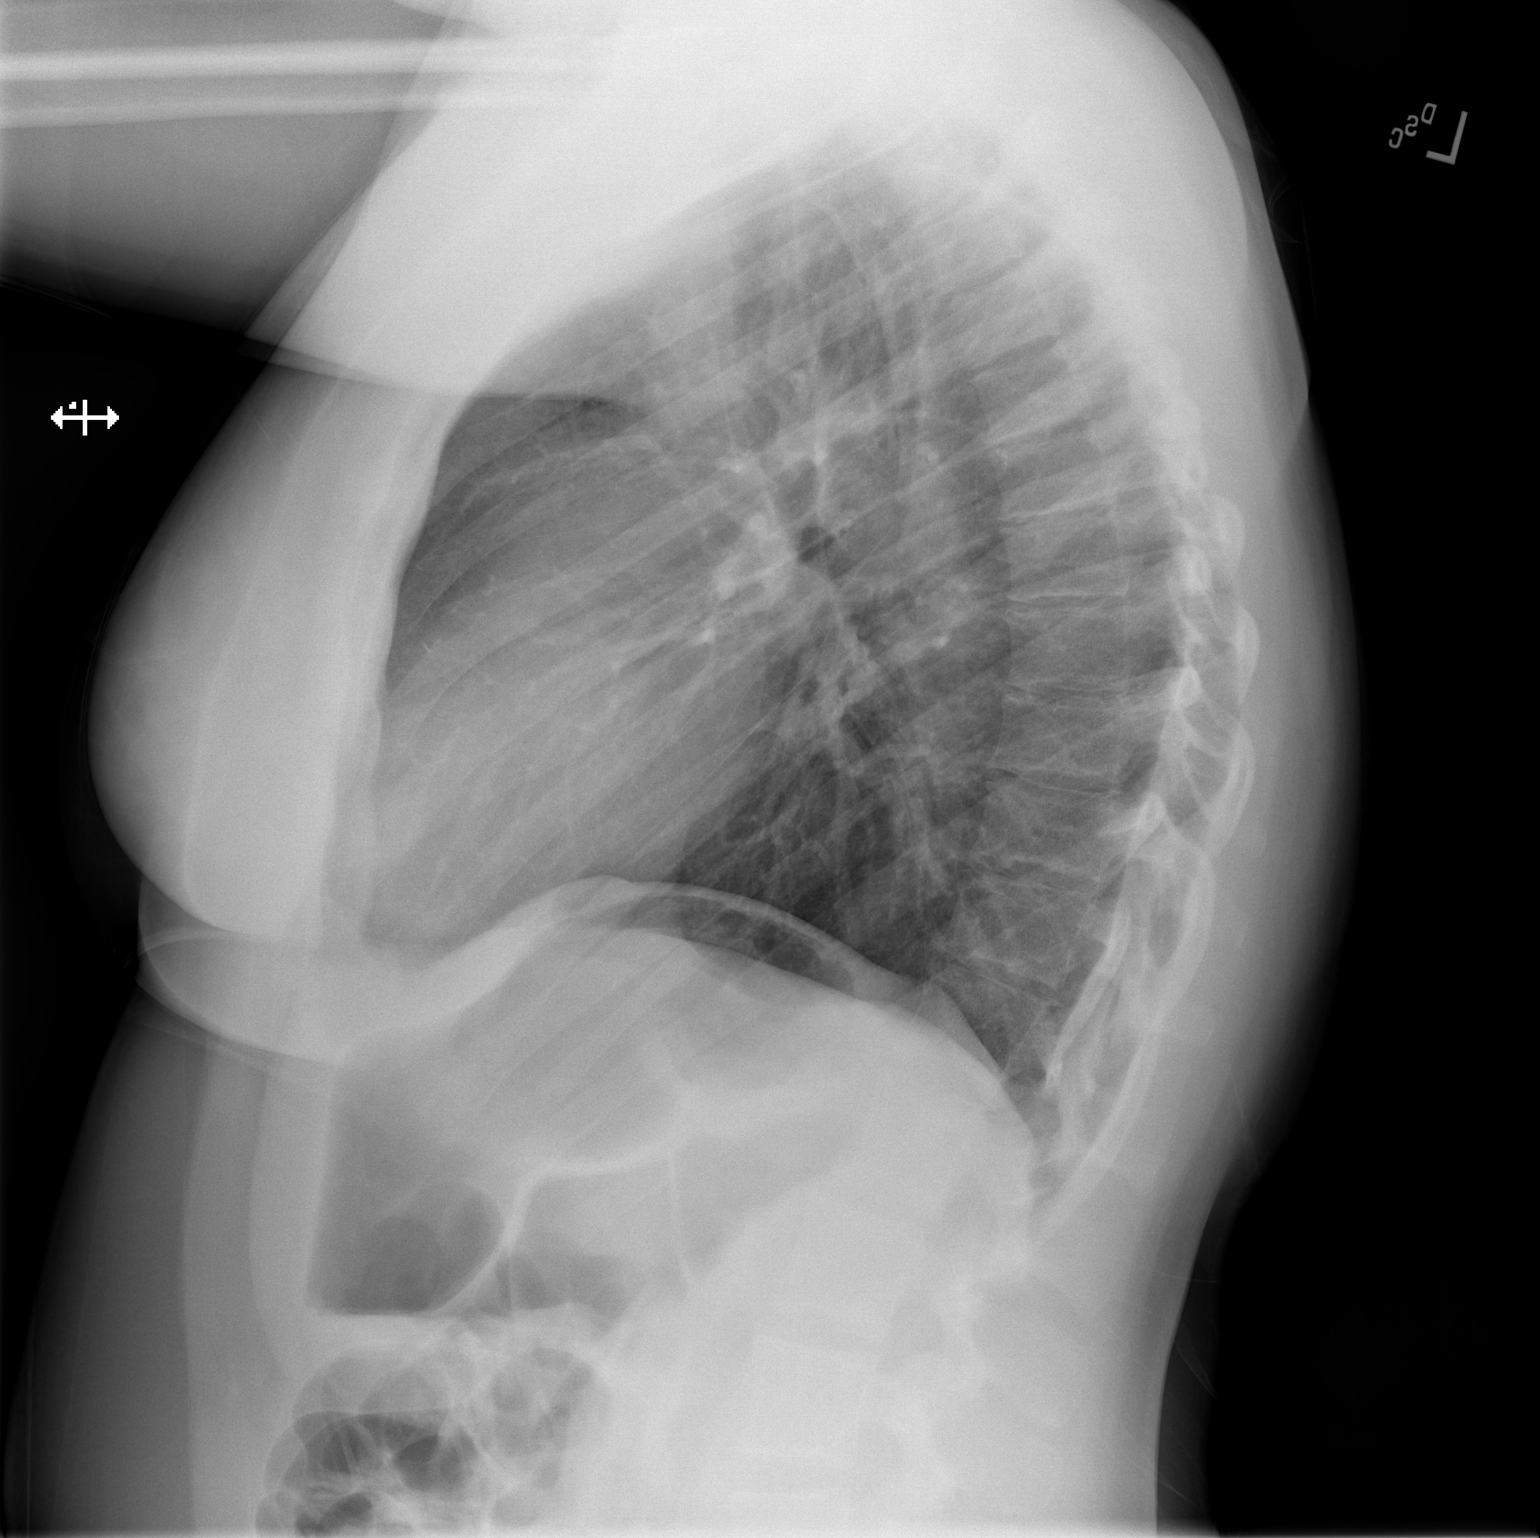

[2 of 2 positions shown; findings below may reference images not displayed]

FINDINGS: The heart size and mediastinal contours are within normal limits.
Both lungs are clear. No pleural effusion. The visualized skeletal
structures are unremarkable.
IMPRESSION: No active cardiopulmonary disease.

## 2022-01-04 ENCOUNTER — Emergency Department (HOSPITAL_COMMUNITY): Payer: BC Managed Care – PPO

## 2022-01-04 ENCOUNTER — Emergency Department (HOSPITAL_COMMUNITY)
Admission: EM | Admit: 2022-01-04 | Discharge: 2022-01-04 | Disposition: A | Discharge status: Home / Self Care | Payer: BC Managed Care – PPO | Attending: Emergency Medicine | Admitting: Emergency Medicine

## 2022-01-04 ENCOUNTER — Other Ambulatory Visit: Payer: Self-pay

## 2022-01-04 ENCOUNTER — Encounter (HOSPITAL_COMMUNITY): Payer: Self-pay

## 2022-01-04 DIAGNOSIS — Z79899 Other long term (current) drug therapy: Secondary | ICD-10-CM | POA: Diagnosis not present

## 2022-01-04 DIAGNOSIS — Z20822 Contact with and (suspected) exposure to covid-19: Secondary | ICD-10-CM | POA: Diagnosis not present

## 2022-01-04 DIAGNOSIS — J45901 Unspecified asthma with (acute) exacerbation: Secondary | ICD-10-CM | POA: Diagnosis not present

## 2022-01-04 DIAGNOSIS — Z7952 Long term (current) use of systemic steroids: Secondary | ICD-10-CM | POA: Insufficient documentation

## 2022-01-04 DIAGNOSIS — R079 Chest pain, unspecified: Secondary | ICD-10-CM | POA: Diagnosis present

## 2022-01-04 LAB — RESP PANEL BY RT-PCR (FLU A&B, COVID) ARPGX2
Influenza A by PCR: NEGATIVE
Influenza B by PCR: NEGATIVE
SARS Coronavirus 2 by RT PCR: NEGATIVE

## 2022-01-04 MED ORDER — IPRATROPIUM-ALBUTEROL 0.5-2.5 (3) MG/3ML IN SOLN
3.0000 mL | Freq: Once | RESPIRATORY_TRACT | Status: AC
  Administered 2022-01-04: 3 mL via RESPIRATORY_TRACT
  Filled 2022-01-04: qty 3

## 2022-01-04 MED ORDER — PREDNISONE 20 MG PO TABS
60.0000 mg | ORAL_TABLET | Freq: Once | ORAL | Status: AC
  Administered 2022-01-04: 60 mg via ORAL
  Filled 2022-01-04: qty 3

## 2022-01-04 MED ORDER — PREDNISONE 50 MG PO TABS: 50.0000 mg | ORAL_TABLET | Freq: Every day | ORAL | 0 refills | Status: AC

## 2022-01-04 NOTE — ED Provider Triage Note (Signed)
Emergency Medicine Provider Triage Evaluation Note  Erica Collins , a 21 y.o. female  was evaluated in triage.  Pt complains of sob, cough, wheezing that started tonight. Tried home nebs which did not improved sxs.  Review of Systems  Positive: Cough, wheezing, sob Negative: fever  Physical Exam  BP 129/76 (BP Location: Left Arm)    Pulse (!) 104    Temp 97.7 F (36.5 C) (Oral)    Resp (!) 24    Ht 5\' 6"  (1.676 m)    Wt 77.1 kg    SpO2 94%    BMI 27.44 kg/m  Gen:   Awake, no distress   Resp:  Normal effort  MSK:   Moves extremities without difficulty  Other:  Diffuse wheezing throughout, no rales  Medical Decision Making  Medically screening exam initiated at 4:49 AM.  Appropriate orders placed.  Erica Collins was informed that the remainder of the evaluation will be completed by another provider, this initial triage assessment does not replace that evaluation, and the importance of remaining in the ED until their evaluation is complete.     Rodney Booze, [place] 01/04/22 [phone]

## 2022-01-04 NOTE — Discharge Instructions (Signed)
Take prednisone as directed   Use your nebulizer treatments every 6 hours for the next 3 days  Please follow up with your primary care provider within 5-7 days for re-evaluation of your symptoms. If you do not have a primary care provider, information for a healthcare clinic has been provided for you to make arrangements for follow up care. Please return to the emergency department for any new or worsening symptoms.

## 2022-01-04 NOTE — ED Provider Notes (Signed)
[facility] EMERGENCY DEPARTMENT Provider Note   CSN: [id number] Arrival date & time: 01/04/22  0426     History  Chief Complaint  Patient presents with   Chest Pain    Erica Collins is a 21 y.o. female.  HPI  21 year old female presents to the emergency department today for evaluation of shortness of breath cough and wheezing that started last night.  Patient with history of asthma that has been flaring up more recently over the last 6 months.  She is required a course of steroids several times.  She is not had any recent fevers or other URI symptoms.  Home Medications Prior to Admission medications   Medication Sig Start Date End Date Taking? Authorizing Provider  predniSONE (DELTASONE) 50 MG tablet Take 1 tablet (50 mg total) by mouth daily for 5 days. 01/04/22 01/09/22 Yes Sunni Richardson S, PA-C  albuterol (PROVENTIL) (2.5 MG/3ML) 0.083% nebulizer solution Take 3 mLs (2.5 mg total) by nebulization every 6 (six) hours as needed for wheezing or shortness of breath. 08/01/21   Horton, Clabe Seal, DO  albuterol (PROVENTIL) (5 MG/ML) 0.5% nebulizer solution Take 0.5 mLs (2.5 mg total) by nebulization every 6 (six) hours as needed for wheezing or shortness of breath. 08/21/21   Arby Barrette, MD  albuterol (VENTOLIN HFA) 108 (90 Base) MCG/ACT inhaler Inhale 2 puffs into the lungs every 4 (four) hours as needed for wheezing or shortness of breath. 08/21/21   Arby Barrette, MD  Budesonide (PULMICORT FLEXHALER) 90 MCG/ACT inhaler Inhale 2 puffs into the lungs 2 (two) times daily. 08/21/21   Arby Barrette, MD  ondansetron (ZOFRAN) 4 MG tablet Take 1 tablet (4 mg total) by mouth every 8 (eight) hours as needed. 02/04/19   Caccavale, Sophia, PA-C  predniSONE (DELTASONE) 10 MG tablet Take 2 tablets (20 mg total) by mouth daily. Take 2 tablets (20 mg) by mouth daily for 5 days. Then take 1 tablet (10 mg) by mouth daily for 5 days. 08/01/21   Horton, Clabe Seal, DO   predniSONE (DELTASONE) 20 MG tablet 2 tabs po daily x 4 days 08/21/21   Arby Barrette, MD      Allergies    Patient has no known allergies.    Review of Systems   Review of Systems See HPI for pertinent positives or negatives.   Physical Exam Updated Vital Signs BP 133/86 (BP Location: Right Arm)    Pulse 92    Temp 98 F (36.7 C) (Oral)    Resp 18    Ht 5\' 6"  (1.676 m)    Wt 77.1 kg    SpO2 98%    BMI 27.44 kg/m  Physical Exam Vitals and nursing note reviewed.  Constitutional:      General: She is not in acute distress.    Appearance: She is well-developed.  HENT:     Head: Normocephalic and atraumatic.  Eyes:     Conjunctiva/sclera: Conjunctivae normal.  Cardiovascular:     Rate and Rhythm: Normal rate and regular rhythm.     Heart sounds: No murmur heard. Pulmonary:     Effort: Pulmonary effort is normal. No respiratory distress.     Breath sounds: Wheezing present.  Abdominal:     Palpations: Abdomen is soft.     Tenderness: There is no abdominal tenderness.  Musculoskeletal:        General: No swelling.     Cervical back: Neck supple.  Skin:    General: Skin is warm and  dry.     Capillary Refill: Capillary refill takes less than 2 seconds.  Neurological:     Mental Status: She is alert.  Psychiatric:        Mood and Affect: Mood normal.    ED Results / Procedures / Treatments   Labs (all labs ordered are listed, but only abnormal results are displayed) Labs Reviewed  RESP PANEL BY RT-PCR (FLU A&B, COVID) [id number]    EKG EKG Interpretation  Date/Time:  Friday January 04 2022 04:47:51 EST Ventricular Rate:  103 PR Interval:  144 QRS Duration: 70 QT Interval:  352 QTC Calculation: 461 R Axis:   12 Text Interpretation: Sinus tachycardia Otherwise normal ECG When compared with ECG of 21-Aug-2021 08:57, PREVIOUS ECG IS PRESENT Confirmed by Zadie Rhine ([id number]) on 01/04/2022 5:05:40 AM  Radiology DG Chest Portable 1 View  Result Date:  01/04/2022 CLINICAL DATA:  21 year old female woke with chest pain. Shortness of breath despite inhaler. EXAM: PORTABLE CHEST 1 VIEW COMPARISON:  Chest radiographs 08/21/2021 and earlier. FINDINGS: PA view at 0544 hours. Normal lung volumes and mediastinal contours. Visualized tracheal air column is within normal limits. Lung markings remain within normal limits. Both lungs appear clear. No pneumothorax or pleural effusion. Negative visible bowel gas and osseous structures. IMPRESSION: Negative.  No cardiopulmonary abnormality. Electronically Signed   By: Odessa Fleming M.D.   On: 01/04/2022 06:30    Procedures Procedures    Medications Ordered in ED Medications  ipratropium-albuterol (DUONEB) 0.5-2.5 (3) MG/3ML nebulizer solution 3 mL (3 mLs Nebulization Given 01/04/22 0455)  predniSONE (DELTASONE) tablet 60 mg (60 mg Oral Given 01/04/22 0454)    ED Course/ Medical Decision Making/ A&P                           Medical Decision Making Amount and/or Complexity of Data Reviewed Radiology: ordered.  Risk Prescription drug management.   21 year old female presents to the emergency department today for evaluation of shortness of breath, wheezing and cough that started last night.  History of asthma.  Symptoms feel similar.  No fevers.  Patient initially marginally tachycardic after doing several nebulizer treatments at home.  Her oxygen saturations are reassuring and she looks comfortable on my evaluation.  Her chest x-ray was reviewed and interpreted and does not show any evidence of pneumonia, pneumothorax or other abnormality at this time.  EKG shows sinus tach but is otherwise nonconcerning at this time.  She was given prednisone and a DuoNeb here in the ED and on reassessment she states she feels significantly improved.  Repeat lung exam reveals essentially resolved wheezing and patient is now having clear lung sounds.  We will treat as uncomplicated asthma exacerbation.  Doubt other emergent cause of  symptoms such as PE or other cardiopulmonary etiology.  Have discussed plan for follow-up with her PCP for further evaluation.  Advised on return precautions.  She voices understanding of the plan and reasons to return.  All questions answered.  Patient stable for discharge.   Final Clinical Impression(s) / ED Diagnoses Final diagnoses:  Exacerbation of asthma, unspecified asthma severity, unspecified whether persistent    Rx / DC Orders ED Discharge Orders          Ordered    predniSONE (DELTASONE) 50 MG tablet  Daily        01/04/22 0658              Karrie Meres, PA-C 01/04/22 [phone]  Zadie RhineWickline, Donald, MD 01/04/22 [phone]

## 2022-01-04 NOTE — ED Triage Notes (Signed)
Pt arrived POV from home c/o chest tightness that woke her up out of her sleep. Pt has a hx of asthma. Pt gave herself 2 albuterol neb treatments with no improvement.

## 2023-07-01 ENCOUNTER — Encounter (HOSPITAL_COMMUNITY): Payer: Self-pay

## 2023-07-01 ENCOUNTER — Emergency Department (HOSPITAL_COMMUNITY)
Admission: EM | Admit: 2023-07-01 | Discharge: 2023-07-01 | Discharge status: Against Medical Advice | Payer: BC Managed Care – PPO | Attending: Emergency Medicine | Admitting: Emergency Medicine

## 2023-07-01 DIAGNOSIS — F419 Anxiety disorder, unspecified: Secondary | ICD-10-CM | POA: Diagnosis present

## 2023-07-01 DIAGNOSIS — Z5321 Procedure and treatment not carried out due to patient leaving prior to being seen by health care provider: Secondary | ICD-10-CM | POA: Insufficient documentation

## 2023-07-01 NOTE — ED Triage Notes (Signed)
EMS reports they were called to bar where patient received 2 double shots and thinks may have had a reaction with her Lexapro. A&O x 4.Reports pt is anxious.

## 2023-08-06 DIAGNOSIS — F4312 Post-traumatic stress disorder, chronic: Secondary | ICD-10-CM | POA: Diagnosis not present

## 2023-08-12 DIAGNOSIS — F4312 Post-traumatic stress disorder, chronic: Secondary | ICD-10-CM | POA: Diagnosis not present

## 2023-08-25 DIAGNOSIS — F4312 Post-traumatic stress disorder, chronic: Secondary | ICD-10-CM | POA: Diagnosis not present

## 2023-09-03 DIAGNOSIS — F4312 Post-traumatic stress disorder, chronic: Secondary | ICD-10-CM | POA: Diagnosis not present

## 2023-09-10 DIAGNOSIS — F411 Generalized anxiety disorder: Secondary | ICD-10-CM | POA: Diagnosis not present

## 2023-09-10 DIAGNOSIS — F429 Obsessive-compulsive disorder, unspecified: Secondary | ICD-10-CM | POA: Diagnosis not present

## 2023-09-17 DIAGNOSIS — F4312 Post-traumatic stress disorder, chronic: Secondary | ICD-10-CM | POA: Diagnosis not present

## 2023-09-18 DIAGNOSIS — F411 Generalized anxiety disorder: Secondary | ICD-10-CM | POA: Diagnosis not present

## 2023-09-18 DIAGNOSIS — F429 Obsessive-compulsive disorder, unspecified: Secondary | ICD-10-CM | POA: Diagnosis not present

## 2023-09-24 DIAGNOSIS — F1020 Alcohol dependence, uncomplicated: Secondary | ICD-10-CM | POA: Diagnosis not present

## 2023-09-25 DIAGNOSIS — F1020 Alcohol dependence, uncomplicated: Secondary | ICD-10-CM | POA: Diagnosis not present

## 2023-09-26 DIAGNOSIS — F1020 Alcohol dependence, uncomplicated: Secondary | ICD-10-CM | POA: Diagnosis not present

## 2023-09-27 DIAGNOSIS — F1020 Alcohol dependence, uncomplicated: Secondary | ICD-10-CM | POA: Diagnosis not present

## 2023-09-28 DIAGNOSIS — F1020 Alcohol dependence, uncomplicated: Secondary | ICD-10-CM | POA: Diagnosis not present

## 2023-09-29 DIAGNOSIS — F1020 Alcohol dependence, uncomplicated: Secondary | ICD-10-CM | POA: Diagnosis not present

## 2023-09-30 DIAGNOSIS — F1020 Alcohol dependence, uncomplicated: Secondary | ICD-10-CM | POA: Diagnosis not present

## 2023-09-30 DIAGNOSIS — F332 Major depressive disorder, recurrent severe without psychotic features: Secondary | ICD-10-CM | POA: Diagnosis not present

## 2023-09-30 DIAGNOSIS — Z79899 Other long term (current) drug therapy: Secondary | ICD-10-CM | POA: Diagnosis not present

## 2023-09-30 DIAGNOSIS — F411 Generalized anxiety disorder: Secondary | ICD-10-CM | POA: Diagnosis not present

## 2023-09-30 DIAGNOSIS — F4310 Post-traumatic stress disorder, unspecified: Secondary | ICD-10-CM | POA: Diagnosis not present

## 2023-09-30 DIAGNOSIS — F429 Obsessive-compulsive disorder, unspecified: Secondary | ICD-10-CM | POA: Diagnosis not present

## 2023-10-01 DIAGNOSIS — F1020 Alcohol dependence, uncomplicated: Secondary | ICD-10-CM | POA: Diagnosis not present

## 2023-10-02 DIAGNOSIS — F411 Generalized anxiety disorder: Secondary | ICD-10-CM | POA: Diagnosis not present

## 2023-10-02 DIAGNOSIS — Z79899 Other long term (current) drug therapy: Secondary | ICD-10-CM | POA: Diagnosis not present

## 2023-10-02 DIAGNOSIS — F429 Obsessive-compulsive disorder, unspecified: Secondary | ICD-10-CM | POA: Diagnosis not present

## 2023-10-02 DIAGNOSIS — F1020 Alcohol dependence, uncomplicated: Secondary | ICD-10-CM | POA: Diagnosis not present

## 2023-10-03 DIAGNOSIS — F1020 Alcohol dependence, uncomplicated: Secondary | ICD-10-CM | POA: Diagnosis not present

## 2023-10-04 DIAGNOSIS — F1020 Alcohol dependence, uncomplicated: Secondary | ICD-10-CM | POA: Diagnosis not present

## 2023-10-05 DIAGNOSIS — F1020 Alcohol dependence, uncomplicated: Secondary | ICD-10-CM | POA: Diagnosis not present

## 2023-10-06 DIAGNOSIS — F429 Obsessive-compulsive disorder, unspecified: Secondary | ICD-10-CM | POA: Diagnosis not present

## 2023-10-06 DIAGNOSIS — F1020 Alcohol dependence, uncomplicated: Secondary | ICD-10-CM | POA: Diagnosis not present

## 2023-10-06 DIAGNOSIS — F411 Generalized anxiety disorder: Secondary | ICD-10-CM | POA: Diagnosis not present

## 2023-10-06 DIAGNOSIS — Z79899 Other long term (current) drug therapy: Secondary | ICD-10-CM | POA: Diagnosis not present

## 2023-10-07 DIAGNOSIS — F1020 Alcohol dependence, uncomplicated: Secondary | ICD-10-CM | POA: Diagnosis not present

## 2023-10-08 DIAGNOSIS — Z79899 Other long term (current) drug therapy: Secondary | ICD-10-CM | POA: Diagnosis not present

## 2023-10-08 DIAGNOSIS — F1020 Alcohol dependence, uncomplicated: Secondary | ICD-10-CM | POA: Diagnosis not present

## 2023-10-08 DIAGNOSIS — F411 Generalized anxiety disorder: Secondary | ICD-10-CM | POA: Diagnosis not present

## 2023-10-08 DIAGNOSIS — F429 Obsessive-compulsive disorder, unspecified: Secondary | ICD-10-CM | POA: Diagnosis not present

## 2023-10-09 DIAGNOSIS — F1020 Alcohol dependence, uncomplicated: Secondary | ICD-10-CM | POA: Diagnosis not present

## 2023-10-10 DIAGNOSIS — F1020 Alcohol dependence, uncomplicated: Secondary | ICD-10-CM | POA: Diagnosis not present

## 2023-10-11 DIAGNOSIS — F1020 Alcohol dependence, uncomplicated: Secondary | ICD-10-CM | POA: Diagnosis not present

## 2023-10-12 DIAGNOSIS — F1020 Alcohol dependence, uncomplicated: Secondary | ICD-10-CM | POA: Diagnosis not present

## 2023-10-13 DIAGNOSIS — F1020 Alcohol dependence, uncomplicated: Secondary | ICD-10-CM | POA: Diagnosis not present

## 2023-10-14 DIAGNOSIS — F429 Obsessive-compulsive disorder, unspecified: Secondary | ICD-10-CM | POA: Diagnosis not present

## 2023-10-14 DIAGNOSIS — F332 Major depressive disorder, recurrent severe without psychotic features: Secondary | ICD-10-CM | POA: Diagnosis not present

## 2023-10-14 DIAGNOSIS — F1020 Alcohol dependence, uncomplicated: Secondary | ICD-10-CM | POA: Diagnosis not present

## 2023-10-14 DIAGNOSIS — Z79899 Other long term (current) drug therapy: Secondary | ICD-10-CM | POA: Diagnosis not present

## 2023-10-14 DIAGNOSIS — F4310 Post-traumatic stress disorder, unspecified: Secondary | ICD-10-CM | POA: Diagnosis not present

## 2023-10-14 DIAGNOSIS — F411 Generalized anxiety disorder: Secondary | ICD-10-CM | POA: Diagnosis not present

## 2023-10-15 DIAGNOSIS — F1020 Alcohol dependence, uncomplicated: Secondary | ICD-10-CM | POA: Diagnosis not present

## 2023-10-16 DIAGNOSIS — F1020 Alcohol dependence, uncomplicated: Secondary | ICD-10-CM | POA: Diagnosis not present

## 2023-10-17 DIAGNOSIS — Z79899 Other long term (current) drug therapy: Secondary | ICD-10-CM | POA: Diagnosis not present

## 2023-10-17 DIAGNOSIS — F411 Generalized anxiety disorder: Secondary | ICD-10-CM | POA: Diagnosis not present

## 2023-10-17 DIAGNOSIS — F1020 Alcohol dependence, uncomplicated: Secondary | ICD-10-CM | POA: Diagnosis not present

## 2023-10-17 DIAGNOSIS — F429 Obsessive-compulsive disorder, unspecified: Secondary | ICD-10-CM | POA: Diagnosis not present

## 2023-10-18 DIAGNOSIS — F1020 Alcohol dependence, uncomplicated: Secondary | ICD-10-CM | POA: Diagnosis not present

## 2023-10-19 DIAGNOSIS — F1020 Alcohol dependence, uncomplicated: Secondary | ICD-10-CM | POA: Diagnosis not present

## 2023-10-20 DIAGNOSIS — F1020 Alcohol dependence, uncomplicated: Secondary | ICD-10-CM | POA: Diagnosis not present

## 2023-10-21 DIAGNOSIS — F1020 Alcohol dependence, uncomplicated: Secondary | ICD-10-CM | POA: Diagnosis not present

## 2023-10-21 DIAGNOSIS — F411 Generalized anxiety disorder: Secondary | ICD-10-CM | POA: Diagnosis not present

## 2023-10-21 DIAGNOSIS — F429 Obsessive-compulsive disorder, unspecified: Secondary | ICD-10-CM | POA: Diagnosis not present

## 2023-10-21 DIAGNOSIS — Z79899 Other long term (current) drug therapy: Secondary | ICD-10-CM | POA: Diagnosis not present

## 2023-10-22 DIAGNOSIS — F1020 Alcohol dependence, uncomplicated: Secondary | ICD-10-CM | POA: Diagnosis not present

## 2023-10-23 DIAGNOSIS — F429 Obsessive-compulsive disorder, unspecified: Secondary | ICD-10-CM | POA: Diagnosis not present

## 2023-10-23 DIAGNOSIS — F411 Generalized anxiety disorder: Secondary | ICD-10-CM | POA: Diagnosis not present

## 2023-10-23 DIAGNOSIS — Z79899 Other long term (current) drug therapy: Secondary | ICD-10-CM | POA: Diagnosis not present

## 2023-10-23 DIAGNOSIS — F1020 Alcohol dependence, uncomplicated: Secondary | ICD-10-CM | POA: Diagnosis not present

## 2023-10-24 DIAGNOSIS — F1020 Alcohol dependence, uncomplicated: Secondary | ICD-10-CM | POA: Diagnosis not present

## 2023-10-25 DIAGNOSIS — F1020 Alcohol dependence, uncomplicated: Secondary | ICD-10-CM | POA: Diagnosis not present

## 2023-10-26 DIAGNOSIS — F1020 Alcohol dependence, uncomplicated: Secondary | ICD-10-CM | POA: Diagnosis not present

## 2023-10-27 DIAGNOSIS — F429 Obsessive-compulsive disorder, unspecified: Secondary | ICD-10-CM | POA: Diagnosis not present

## 2023-10-27 DIAGNOSIS — F1020 Alcohol dependence, uncomplicated: Secondary | ICD-10-CM | POA: Diagnosis not present

## 2023-10-27 DIAGNOSIS — F411 Generalized anxiety disorder: Secondary | ICD-10-CM | POA: Diagnosis not present

## 2023-10-27 DIAGNOSIS — Z79899 Other long term (current) drug therapy: Secondary | ICD-10-CM | POA: Diagnosis not present

## 2023-10-28 DIAGNOSIS — F1020 Alcohol dependence, uncomplicated: Secondary | ICD-10-CM | POA: Diagnosis not present

## 2023-10-28 DIAGNOSIS — F411 Generalized anxiety disorder: Secondary | ICD-10-CM | POA: Diagnosis not present

## 2023-10-28 DIAGNOSIS — F4310 Post-traumatic stress disorder, unspecified: Secondary | ICD-10-CM | POA: Diagnosis not present

## 2023-10-28 DIAGNOSIS — F332 Major depressive disorder, recurrent severe without psychotic features: Secondary | ICD-10-CM | POA: Diagnosis not present

## 2023-10-29 DIAGNOSIS — F429 Obsessive-compulsive disorder, unspecified: Secondary | ICD-10-CM | POA: Diagnosis not present

## 2023-10-29 DIAGNOSIS — Z79899 Other long term (current) drug therapy: Secondary | ICD-10-CM | POA: Diagnosis not present

## 2023-10-29 DIAGNOSIS — F411 Generalized anxiety disorder: Secondary | ICD-10-CM | POA: Diagnosis not present

## 2023-10-29 DIAGNOSIS — F1020 Alcohol dependence, uncomplicated: Secondary | ICD-10-CM | POA: Diagnosis not present

## 2023-10-31 DIAGNOSIS — F1020 Alcohol dependence, uncomplicated: Secondary | ICD-10-CM | POA: Diagnosis not present

## 2023-11-01 DIAGNOSIS — F1020 Alcohol dependence, uncomplicated: Secondary | ICD-10-CM | POA: Diagnosis not present

## 2023-11-02 DIAGNOSIS — F1020 Alcohol dependence, uncomplicated: Secondary | ICD-10-CM | POA: Diagnosis not present

## 2023-11-03 DIAGNOSIS — F411 Generalized anxiety disorder: Secondary | ICD-10-CM | POA: Diagnosis not present

## 2023-11-03 DIAGNOSIS — Z79899 Other long term (current) drug therapy: Secondary | ICD-10-CM | POA: Diagnosis not present

## 2023-11-03 DIAGNOSIS — F1020 Alcohol dependence, uncomplicated: Secondary | ICD-10-CM | POA: Diagnosis not present

## 2023-11-03 DIAGNOSIS — F429 Obsessive-compulsive disorder, unspecified: Secondary | ICD-10-CM | POA: Diagnosis not present

## 2023-11-04 DIAGNOSIS — F1020 Alcohol dependence, uncomplicated: Secondary | ICD-10-CM | POA: Diagnosis not present

## 2023-11-05 DIAGNOSIS — Z79899 Other long term (current) drug therapy: Secondary | ICD-10-CM | POA: Diagnosis not present

## 2023-11-05 DIAGNOSIS — F429 Obsessive-compulsive disorder, unspecified: Secondary | ICD-10-CM | POA: Diagnosis not present

## 2023-11-05 DIAGNOSIS — F1020 Alcohol dependence, uncomplicated: Secondary | ICD-10-CM | POA: Diagnosis not present

## 2023-11-05 DIAGNOSIS — F411 Generalized anxiety disorder: Secondary | ICD-10-CM | POA: Diagnosis not present

## 2023-11-06 DIAGNOSIS — F1020 Alcohol dependence, uncomplicated: Secondary | ICD-10-CM | POA: Diagnosis not present

## 2023-11-07 DIAGNOSIS — F1020 Alcohol dependence, uncomplicated: Secondary | ICD-10-CM | POA: Diagnosis not present

## 2023-11-10 DIAGNOSIS — F1020 Alcohol dependence, uncomplicated: Secondary | ICD-10-CM | POA: Diagnosis not present

## 2023-11-12 DIAGNOSIS — F1020 Alcohol dependence, uncomplicated: Secondary | ICD-10-CM | POA: Diagnosis not present

## 2023-11-12 DIAGNOSIS — Z79899 Other long term (current) drug therapy: Secondary | ICD-10-CM | POA: Diagnosis not present

## 2023-11-12 DIAGNOSIS — F429 Obsessive-compulsive disorder, unspecified: Secondary | ICD-10-CM | POA: Diagnosis not present

## 2023-11-12 DIAGNOSIS — F411 Generalized anxiety disorder: Secondary | ICD-10-CM | POA: Diagnosis not present

## 2023-11-13 DIAGNOSIS — F1020 Alcohol dependence, uncomplicated: Secondary | ICD-10-CM | POA: Diagnosis not present

## 2023-11-17 DIAGNOSIS — F429 Obsessive-compulsive disorder, unspecified: Secondary | ICD-10-CM | POA: Diagnosis not present

## 2023-11-17 DIAGNOSIS — F1020 Alcohol dependence, uncomplicated: Secondary | ICD-10-CM | POA: Diagnosis not present

## 2023-11-17 DIAGNOSIS — F411 Generalized anxiety disorder: Secondary | ICD-10-CM | POA: Diagnosis not present

## 2023-11-17 DIAGNOSIS — Z79899 Other long term (current) drug therapy: Secondary | ICD-10-CM | POA: Diagnosis not present

## 2023-11-18 DIAGNOSIS — F411 Generalized anxiety disorder: Secondary | ICD-10-CM | POA: Diagnosis not present

## 2023-11-18 DIAGNOSIS — F4310 Post-traumatic stress disorder, unspecified: Secondary | ICD-10-CM | POA: Diagnosis not present

## 2023-11-18 DIAGNOSIS — F332 Major depressive disorder, recurrent severe without psychotic features: Secondary | ICD-10-CM | POA: Diagnosis not present

## 2023-11-18 DIAGNOSIS — F1020 Alcohol dependence, uncomplicated: Secondary | ICD-10-CM | POA: Diagnosis not present

## 2023-11-19 DIAGNOSIS — F1020 Alcohol dependence, uncomplicated: Secondary | ICD-10-CM | POA: Diagnosis not present

## 2023-11-20 DIAGNOSIS — F411 Generalized anxiety disorder: Secondary | ICD-10-CM | POA: Diagnosis not present

## 2023-11-20 DIAGNOSIS — F1020 Alcohol dependence, uncomplicated: Secondary | ICD-10-CM | POA: Diagnosis not present

## 2023-11-20 DIAGNOSIS — F429 Obsessive-compulsive disorder, unspecified: Secondary | ICD-10-CM | POA: Diagnosis not present

## 2023-11-20 DIAGNOSIS — Z79899 Other long term (current) drug therapy: Secondary | ICD-10-CM | POA: Diagnosis not present

## 2023-11-29 DIAGNOSIS — F1020 Alcohol dependence, uncomplicated: Secondary | ICD-10-CM | POA: Diagnosis not present

## 2023-11-30 DIAGNOSIS — F1020 Alcohol dependence, uncomplicated: Secondary | ICD-10-CM | POA: Diagnosis not present

## 2023-12-01 DIAGNOSIS — F1020 Alcohol dependence, uncomplicated: Secondary | ICD-10-CM | POA: Diagnosis not present

## 2023-12-02 DIAGNOSIS — F411 Generalized anxiety disorder: Secondary | ICD-10-CM | POA: Diagnosis not present

## 2023-12-02 DIAGNOSIS — F1020 Alcohol dependence, uncomplicated: Secondary | ICD-10-CM | POA: Diagnosis not present

## 2023-12-02 DIAGNOSIS — F429 Obsessive-compulsive disorder, unspecified: Secondary | ICD-10-CM | POA: Diagnosis not present

## 2023-12-02 DIAGNOSIS — Z79899 Other long term (current) drug therapy: Secondary | ICD-10-CM | POA: Diagnosis not present

## 2023-12-03 DIAGNOSIS — F1020 Alcohol dependence, uncomplicated: Secondary | ICD-10-CM | POA: Diagnosis not present

## 2023-12-04 DIAGNOSIS — F1020 Alcohol dependence, uncomplicated: Secondary | ICD-10-CM | POA: Diagnosis not present

## 2023-12-05 DIAGNOSIS — F429 Obsessive-compulsive disorder, unspecified: Secondary | ICD-10-CM | POA: Diagnosis not present

## 2023-12-05 DIAGNOSIS — Z79899 Other long term (current) drug therapy: Secondary | ICD-10-CM | POA: Diagnosis not present

## 2023-12-05 DIAGNOSIS — F1020 Alcohol dependence, uncomplicated: Secondary | ICD-10-CM | POA: Diagnosis not present

## 2023-12-05 DIAGNOSIS — F411 Generalized anxiety disorder: Secondary | ICD-10-CM | POA: Diagnosis not present

## 2023-12-06 DIAGNOSIS — F1020 Alcohol dependence, uncomplicated: Secondary | ICD-10-CM | POA: Diagnosis not present

## 2023-12-07 DIAGNOSIS — F1020 Alcohol dependence, uncomplicated: Secondary | ICD-10-CM | POA: Diagnosis not present

## 2023-12-08 DIAGNOSIS — F1020 Alcohol dependence, uncomplicated: Secondary | ICD-10-CM | POA: Diagnosis not present

## 2023-12-09 DIAGNOSIS — F1020 Alcohol dependence, uncomplicated: Secondary | ICD-10-CM | POA: Diagnosis not present

## 2023-12-10 DIAGNOSIS — F429 Obsessive-compulsive disorder, unspecified: Secondary | ICD-10-CM | POA: Diagnosis not present

## 2023-12-10 DIAGNOSIS — F411 Generalized anxiety disorder: Secondary | ICD-10-CM | POA: Diagnosis not present

## 2023-12-10 DIAGNOSIS — Z79899 Other long term (current) drug therapy: Secondary | ICD-10-CM | POA: Diagnosis not present

## 2023-12-10 DIAGNOSIS — F1020 Alcohol dependence, uncomplicated: Secondary | ICD-10-CM | POA: Diagnosis not present

## 2023-12-11 DIAGNOSIS — F1020 Alcohol dependence, uncomplicated: Secondary | ICD-10-CM | POA: Diagnosis not present

## 2023-12-12 DIAGNOSIS — Z79899 Other long term (current) drug therapy: Secondary | ICD-10-CM | POA: Diagnosis not present

## 2023-12-12 DIAGNOSIS — F429 Obsessive-compulsive disorder, unspecified: Secondary | ICD-10-CM | POA: Diagnosis not present

## 2023-12-12 DIAGNOSIS — F411 Generalized anxiety disorder: Secondary | ICD-10-CM | POA: Diagnosis not present

## 2023-12-12 DIAGNOSIS — F1020 Alcohol dependence, uncomplicated: Secondary | ICD-10-CM | POA: Diagnosis not present

## 2023-12-13 DIAGNOSIS — F1020 Alcohol dependence, uncomplicated: Secondary | ICD-10-CM | POA: Diagnosis not present

## 2023-12-14 DIAGNOSIS — F1020 Alcohol dependence, uncomplicated: Secondary | ICD-10-CM | POA: Diagnosis not present

## 2023-12-15 DIAGNOSIS — F1020 Alcohol dependence, uncomplicated: Secondary | ICD-10-CM | POA: Diagnosis not present

## 2023-12-16 DIAGNOSIS — F1020 Alcohol dependence, uncomplicated: Secondary | ICD-10-CM | POA: Diagnosis not present

## 2023-12-16 DIAGNOSIS — F429 Obsessive-compulsive disorder, unspecified: Secondary | ICD-10-CM | POA: Diagnosis not present

## 2023-12-16 DIAGNOSIS — Z79899 Other long term (current) drug therapy: Secondary | ICD-10-CM | POA: Diagnosis not present

## 2023-12-16 DIAGNOSIS — F4310 Post-traumatic stress disorder, unspecified: Secondary | ICD-10-CM | POA: Diagnosis not present

## 2023-12-16 DIAGNOSIS — F411 Generalized anxiety disorder: Secondary | ICD-10-CM | POA: Diagnosis not present

## 2023-12-16 DIAGNOSIS — F332 Major depressive disorder, recurrent severe without psychotic features: Secondary | ICD-10-CM | POA: Diagnosis not present

## 2023-12-17 DIAGNOSIS — F1020 Alcohol dependence, uncomplicated: Secondary | ICD-10-CM | POA: Diagnosis not present

## 2023-12-18 DIAGNOSIS — F1020 Alcohol dependence, uncomplicated: Secondary | ICD-10-CM | POA: Diagnosis not present

## 2023-12-19 DIAGNOSIS — F1020 Alcohol dependence, uncomplicated: Secondary | ICD-10-CM | POA: Diagnosis not present

## 2023-12-20 DIAGNOSIS — F1020 Alcohol dependence, uncomplicated: Secondary | ICD-10-CM | POA: Diagnosis not present

## 2023-12-23 DIAGNOSIS — F1020 Alcohol dependence, uncomplicated: Secondary | ICD-10-CM | POA: Diagnosis not present

## 2023-12-25 DIAGNOSIS — F1020 Alcohol dependence, uncomplicated: Secondary | ICD-10-CM | POA: Diagnosis not present

## 2023-12-29 DIAGNOSIS — F1020 Alcohol dependence, uncomplicated: Secondary | ICD-10-CM | POA: Diagnosis not present

## 2023-12-30 DIAGNOSIS — F1020 Alcohol dependence, uncomplicated: Secondary | ICD-10-CM | POA: Diagnosis not present

## 2024-01-01 DIAGNOSIS — F1020 Alcohol dependence, uncomplicated: Secondary | ICD-10-CM | POA: Diagnosis not present

## 2024-01-05 DIAGNOSIS — F1020 Alcohol dependence, uncomplicated: Secondary | ICD-10-CM | POA: Diagnosis not present

## 2024-01-06 DIAGNOSIS — F1020 Alcohol dependence, uncomplicated: Secondary | ICD-10-CM | POA: Diagnosis not present

## 2024-01-08 DIAGNOSIS — F1020 Alcohol dependence, uncomplicated: Secondary | ICD-10-CM | POA: Diagnosis not present

## 2024-01-12 DIAGNOSIS — F1020 Alcohol dependence, uncomplicated: Secondary | ICD-10-CM | POA: Diagnosis not present

## 2024-01-13 DIAGNOSIS — F4310 Post-traumatic stress disorder, unspecified: Secondary | ICD-10-CM | POA: Diagnosis not present

## 2024-01-13 DIAGNOSIS — F332 Major depressive disorder, recurrent severe without psychotic features: Secondary | ICD-10-CM | POA: Diagnosis not present

## 2024-01-13 DIAGNOSIS — F411 Generalized anxiety disorder: Secondary | ICD-10-CM | POA: Diagnosis not present

## 2024-01-13 DIAGNOSIS — F1020 Alcohol dependence, uncomplicated: Secondary | ICD-10-CM | POA: Diagnosis not present

## 2024-01-15 DIAGNOSIS — F1020 Alcohol dependence, uncomplicated: Secondary | ICD-10-CM | POA: Diagnosis not present

## 2024-01-19 DIAGNOSIS — F1020 Alcohol dependence, uncomplicated: Secondary | ICD-10-CM | POA: Diagnosis not present

## 2024-01-20 DIAGNOSIS — F1020 Alcohol dependence, uncomplicated: Secondary | ICD-10-CM | POA: Diagnosis not present

## 2024-01-22 DIAGNOSIS — F1020 Alcohol dependence, uncomplicated: Secondary | ICD-10-CM | POA: Diagnosis not present

## 2024-01-26 DIAGNOSIS — F1020 Alcohol dependence, uncomplicated: Secondary | ICD-10-CM | POA: Diagnosis not present

## 2024-01-27 DIAGNOSIS — F1020 Alcohol dependence, uncomplicated: Secondary | ICD-10-CM | POA: Diagnosis not present

## 2024-01-29 DIAGNOSIS — F1020 Alcohol dependence, uncomplicated: Secondary | ICD-10-CM | POA: Diagnosis not present

## 2024-02-02 DIAGNOSIS — F1020 Alcohol dependence, uncomplicated: Secondary | ICD-10-CM | POA: Diagnosis not present

## 2024-02-03 DIAGNOSIS — F1020 Alcohol dependence, uncomplicated: Secondary | ICD-10-CM | POA: Diagnosis not present

## 2024-02-05 DIAGNOSIS — F1020 Alcohol dependence, uncomplicated: Secondary | ICD-10-CM | POA: Diagnosis not present

## 2024-02-09 DIAGNOSIS — F1020 Alcohol dependence, uncomplicated: Secondary | ICD-10-CM | POA: Diagnosis not present

## 2024-02-10 ENCOUNTER — Other Ambulatory Visit (HOSPITAL_COMMUNITY): Payer: Self-pay

## 2024-02-10 DIAGNOSIS — F1020 Alcohol dependence, uncomplicated: Secondary | ICD-10-CM | POA: Diagnosis not present

## 2024-02-10 DIAGNOSIS — F332 Major depressive disorder, recurrent severe without psychotic features: Secondary | ICD-10-CM | POA: Diagnosis not present

## 2024-02-10 DIAGNOSIS — F4310 Post-traumatic stress disorder, unspecified: Secondary | ICD-10-CM | POA: Diagnosis not present

## 2024-02-10 DIAGNOSIS — F411 Generalized anxiety disorder: Secondary | ICD-10-CM | POA: Diagnosis not present

## 2024-02-10 MED ORDER — HYDROXYZINE HCL 10 MG PO TABS
10.0000 mg | ORAL_TABLET | Freq: Every evening | ORAL | 0 refills | Status: AC | PRN
  Filled 2024-02-10 – 2024-03-15 (×2): qty 60, 30d supply, fill #0

## 2024-02-10 MED ORDER — AMITRIPTYLINE HCL 50 MG PO TABS
25.0000 mg | ORAL_TABLET | Freq: Every day | ORAL | 0 refills | Status: AC
  Filled 2024-02-10 – 2024-03-15 (×2): qty 30, 30d supply, fill #0

## 2024-02-10 MED ORDER — PRAZOSIN HCL 2 MG PO CAPS
2.0000 mg | ORAL_CAPSULE | Freq: Every evening | ORAL | 0 refills | Status: AC
  Filled 2024-02-10 – 2024-03-15 (×2): qty 30, 30d supply, fill #0

## 2024-02-10 MED ORDER — MONTELUKAST SODIUM 10 MG PO TABS
10.0000 mg | ORAL_TABLET | Freq: Every day | ORAL | 0 refills | Status: AC
  Filled 2024-02-10 – 2024-03-15 (×2): qty 30, 30d supply, fill #0

## 2024-02-10 MED ORDER — BUSPIRONE HCL 15 MG PO TABS
15.0000 mg | ORAL_TABLET | Freq: Three times a day (TID) | ORAL | 0 refills | Status: DC
  Filled 2024-02-10 – 2024-03-15 (×2): qty 90, 30d supply, fill #0

## 2024-02-10 MED ORDER — FLUOXETINE HCL 40 MG PO CAPS
40.0000 mg | ORAL_CAPSULE | Freq: Every day | ORAL | 0 refills | Status: DC
  Filled 2024-02-10 – 2024-03-15 (×2): qty 30, 30d supply, fill #0

## 2024-02-12 DIAGNOSIS — F1020 Alcohol dependence, uncomplicated: Secondary | ICD-10-CM | POA: Diagnosis not present

## 2024-02-16 DIAGNOSIS — F1020 Alcohol dependence, uncomplicated: Secondary | ICD-10-CM | POA: Diagnosis not present

## 2024-02-20 ENCOUNTER — Other Ambulatory Visit (HOSPITAL_COMMUNITY): Payer: Self-pay

## 2024-03-14 ENCOUNTER — Ambulatory Visit
Admission: EM | Admit: 2024-03-14 | Discharge: 2024-03-14 | Disposition: A | Discharge status: Home / Self Care | Attending: Family Medicine | Admitting: Family Medicine

## 2024-03-14 DIAGNOSIS — J4531 Mild persistent asthma with (acute) exacerbation: Secondary | ICD-10-CM | POA: Diagnosis not present

## 2024-03-14 DIAGNOSIS — J309 Allergic rhinitis, unspecified: Secondary | ICD-10-CM

## 2024-03-14 DIAGNOSIS — U071 COVID-19: Secondary | ICD-10-CM | POA: Diagnosis not present

## 2024-03-14 LAB — POC COVID19/FLU A&B COMBO
Covid Antigen, POC: POSITIVE — AB
Influenza A Antigen, POC: NEGATIVE
Influenza B Antigen, POC: NEGATIVE

## 2024-03-14 MED ORDER — PREDNISONE 20 MG PO TABS: ORAL_TABLET | ORAL | 0 refills | Status: AC

## 2024-03-14 MED ORDER — ALBUTEROL SULFATE HFA 108 (90 BASE) MCG/ACT IN AERS: 2.0000 | INHALATION_SPRAY | RESPIRATORY_TRACT | 0 refills | Status: AC | PRN

## 2024-03-14 NOTE — Discharge Instructions (Addendum)
 We will manage this COVID 19 infection with supportive care. For sore throat or cough try using a honey-based tea. Use 3 teaspoons of honey with juice squeezed from half lemon. Place shaved pieces of ginger into 1/2-1 cup of water and warm over stove top. Then mix the ingredients and repeat every 4 hours as needed. Please take Tylenol 650mg  once every 6 hours for fevers, aches and pains. Hydrate very well with at least 2 liters (80 ounces) of water. Eat light meals such as soups (chicken and noodles, chicken wild rice, vegetable).  Do not eat any foods that you are allergic to.  Start an antihistamine like Zyrtec (10mg  daily) for postnasal drainage, sinus congestion.  You can take this together with prednisone for 5 days and albuterol inhaler. Use over-the-counter cough syrup as needed.

## 2024-03-14 NOTE — ED Provider Notes (Signed)
 [facility]  Note:  This document was prepared using Conservation officer, historic buildings and may include unintentional dictation errors.  MRN: [id number] DOB: [birth year]  Subjective:   Erica Collins is a 23 y.o. female presenting for 2-day history of fever, chills, sinus congestion and drainage, productive cough.  Feels like her asthma is flaring up.  Would like a refill on her albuterol inhaler.  She also has allergies.  She does not take her Singulair and antihistamine consistently.  Her employer requires COVID and flu testing. No smoking of any kind including cigarettes, cigars, vaping, marijuana use.    No current facility-administered medications for this encounter.  Current Outpatient Medications:    albuterol (PROVENTIL) (2.5 MG/3ML) 0.083% nebulizer solution, Take 3 mLs (2.5 mg total) by nebulization every 6 (six) hours as needed for wheezing or shortness of breath., Disp: 75 mL, Rfl: 2   albuterol (PROVENTIL) (5 MG/ML) 0.5% nebulizer solution, Take 0.5 mLs (2.5 mg total) by nebulization every 6 (six) hours as needed for wheezing or shortness of breath., Disp: 20 mL, Rfl: 2   albuterol (VENTOLIN HFA) 108 (90 Base) MCG/ACT inhaler, Inhale 2 puffs into the lungs every 4 (four) hours as needed for wheezing or shortness of breath., Disp: 1 each, Rfl: 1   Budesonide (PULMICORT FLEXHALER) 90 MCG/ACT inhaler, Inhale 2 puffs into the lungs 2 (two) times daily., Disp: 1 each, Rfl: 1   busPIRone (BUSPAR) 15 MG tablet, Take 1 tablet (15 mg total) by mouth 3 (three) times daily., Disp: 90 tablet, Rfl: 0   FLUoxetine (PROZAC) 40 MG capsule, Take 1 capsule (40 mg total) by mouth daily., Disp: 30 capsule, Rfl: 0   montelukast (SINGULAIR) 10 MG tablet, Take 1 tablet (10 mg total) by mouth daily., Disp: 30 tablet, Rfl: 0   prazosin (MINIPRESS) 2 MG capsule, Take 1 capsule (2 mg total) by mouth every evening., Disp: 30 capsule, Rfl: 0   amitriptyline (ELAVIL) 50 MG  tablet, Take 0.5-1 tablets (25-50 mg total) by mouth at bedtime if needed., Disp: 30 tablet, Rfl: 0   hydrOXYzine (ATARAX) 10 MG tablet, Take 1-2 tablets (10-20 mg total) by mouth at bedtime as needed for insomnia., Disp: 60 tablet, Rfl: 0   ondansetron (ZOFRAN) 4 MG tablet, Take 1 tablet (4 mg total) by mouth every 8 (eight) hours as needed., Disp: 12 tablet, Rfl: 0   predniSONE (DELTASONE) 10 MG tablet, Take 2 tablets (20 mg total) by mouth daily. Take 2 tablets (20 mg) by mouth daily for 5 days. Then take 1 tablet (10 mg) by mouth daily for 5 days., Disp: 15 tablet, Rfl: 0   predniSONE (DELTASONE) 20 MG tablet, 2 tabs po daily x 4 days, Disp: 8 tablet, Rfl: 0   No Known Allergies  Past Medical History:  Diagnosis Date   Asthma      History reviewed. No pertinent surgical history.  History reviewed. No pertinent family history.  Social History   Tobacco Use   Smoking status: Never   Smokeless tobacco: Never  Substance Use Topics   Alcohol use: Not Currently    ROS   Objective:   Vitals: BP 121/89 (BP Location: Left Arm)   Pulse 79   Temp 97.9 F (36.6 C) (Oral)   Resp 16   LMP 03/14/2024 (Approximate)   SpO2 96%   Physical Exam Constitutional:      General: She is not in acute distress.    Appearance: Normal appearance. She is well-developed and  normal weight. She is not ill-appearing, toxic-appearing or diaphoretic.  HENT:     Head: Normocephalic and atraumatic.     Right Ear: Tympanic membrane, ear canal and external ear normal. No drainage or tenderness. No middle ear effusion. There is no impacted cerumen. Tympanic membrane is not erythematous or bulging.     Left Ear: Tympanic membrane, ear canal and external ear normal. No drainage or tenderness.  No middle ear effusion. There is no impacted cerumen. Tympanic membrane is not erythematous or bulging.     Nose: Nose normal. No congestion or rhinorrhea.     Mouth/Throat:     Mouth: Mucous membranes are moist. No  oral lesions.     Pharynx: No pharyngeal swelling, oropharyngeal exudate, posterior oropharyngeal erythema or uvula swelling.     Tonsils: No tonsillar exudate or tonsillar abscesses.  Eyes:     General: No scleral icterus.       Right eye: No discharge.        Left eye: No discharge.     Extraocular Movements: Extraocular movements intact.     Right eye: Normal extraocular motion.     Left eye: Normal extraocular motion.     Conjunctiva/sclera: Conjunctivae normal.  Cardiovascular:     Rate and Rhythm: Normal rate and regular rhythm.     Heart sounds: Normal heart sounds. No murmur heard.    No friction rub. No gallop.  Pulmonary:     Effort: Pulmonary effort is normal. No respiratory distress.     Breath sounds: No stridor. Wheezing (trace throughout) present. No rhonchi or rales.  Chest:     Chest wall: No tenderness.  Musculoskeletal:     Cervical back: Normal range of motion and neck supple.  Lymphadenopathy:     Cervical: No cervical adenopathy.  Skin:    General: Skin is warm and dry.  Neurological:     General: No focal deficit present.     Mental Status: She is alert and oriented to person, place, and time.  Psychiatric:        Mood and Affect: Mood normal.        Behavior: Behavior normal.     Results for orders placed or performed during the hospital encounter of 03/14/24 (from the past 24 hours)  POC Covid19/Flu A&B Antigen     Status: Abnormal   Collection Time: 03/14/24 11:41 AM  Result Value Ref Range   Influenza A Antigen, POC Negative Negative   Influenza B Antigen, POC Negative Negative   Covid Antigen, POC Positive (A) Negative    Assessment and Plan :   PDMP not reviewed this encounter.  1. COVID-19 virus infection   2. Mild persistent asthma with exacerbation   3. Allergic rhinitis, unspecified seasonality, unspecified trigger    Recommended an oral prednisone course in the context of her asthma, allergic rhinitis.  Start taking antihistamine  daily together with Singulair daily.  Refilled her albuterol.  Recommend general supportive care otherwise for COVID-19 virus infection.  Patient declined COVID antivirals.  Will defer imaging for now.  Counseled patient on potential for adverse effects with medications prescribed/recommended today, ER and return-to-clinic precautions discussed, patient verbalized understanding.    Adolph Hoop, [place] 03/14/24 1242

## 2024-03-14 NOTE — ED Triage Notes (Signed)
 Patient is here for congestion, chills and fever that started x 2 days.

## 2024-03-15 ENCOUNTER — Other Ambulatory Visit (HOSPITAL_COMMUNITY): Payer: Self-pay

## 2024-03-30 ENCOUNTER — Other Ambulatory Visit (HOSPITAL_COMMUNITY): Payer: Self-pay

## 2024-03-30 DIAGNOSIS — F4310 Post-traumatic stress disorder, unspecified: Secondary | ICD-10-CM | POA: Diagnosis not present

## 2024-03-30 DIAGNOSIS — F332 Major depressive disorder, recurrent severe without psychotic features: Secondary | ICD-10-CM | POA: Diagnosis not present

## 2024-03-30 DIAGNOSIS — F411 Generalized anxiety disorder: Secondary | ICD-10-CM | POA: Diagnosis not present

## 2024-03-30 DIAGNOSIS — F1020 Alcohol dependence, uncomplicated: Secondary | ICD-10-CM | POA: Diagnosis not present

## 2024-03-30 MED ORDER — FLUOXETINE HCL 40 MG PO CAPS
ORAL_CAPSULE | Freq: Every day | ORAL | 1 refills | Status: AC
  Filled 2024-03-30: qty 30, 30d supply, fill #0

## 2024-03-30 MED ORDER — BUSPIRONE HCL 15 MG PO TABS
15.0000 mg | ORAL_TABLET | Freq: Two times a day (BID) | ORAL | 1 refills | Status: AC
  Filled 2024-03-30: qty 90, 30d supply, fill #0

## 2024-03-30 MED ORDER — HYDROXYZINE HCL 10 MG PO TABS
10.0000 mg | ORAL_TABLET | Freq: Every day | ORAL | 1 refills | Status: AC
  Filled 2024-03-30: qty 60, 30d supply, fill #0

## 2024-08-18 ENCOUNTER — Other Ambulatory Visit (HOSPITAL_COMMUNITY): Payer: Self-pay

## 2024-08-18 MED ORDER — METRONIDAZOLE 500 MG PO TABS
500.0000 mg | ORAL_TABLET | Freq: Two times a day (BID) | ORAL | 0 refills | Status: AC
  Filled 2024-08-18: qty 14, 7d supply, fill #0

## 2024-08-18 MED ORDER — FLUCONAZOLE 150 MG PO TABS
150.0000 mg | ORAL_TABLET | Freq: Once | ORAL | 0 refills | Status: AC
  Filled 2024-08-18: qty 1, 1d supply, fill #0

## 2024-10-13 ENCOUNTER — Other Ambulatory Visit: Payer: Self-pay

## 2024-10-13 ENCOUNTER — Other Ambulatory Visit (HOSPITAL_COMMUNITY): Payer: Self-pay

## 2024-10-13 DIAGNOSIS — F4310 Post-traumatic stress disorder, unspecified: Secondary | ICD-10-CM | POA: Diagnosis not present

## 2024-10-13 DIAGNOSIS — F332 Major depressive disorder, recurrent severe without psychotic features: Secondary | ICD-10-CM | POA: Diagnosis not present

## 2024-10-13 DIAGNOSIS — F411 Generalized anxiety disorder: Secondary | ICD-10-CM | POA: Diagnosis not present

## 2024-10-13 DIAGNOSIS — F1020 Alcohol dependence, uncomplicated: Secondary | ICD-10-CM | POA: Diagnosis not present

## 2024-10-13 MED ORDER — HYDROXYZINE HCL 10 MG PO TABS
10.0000 mg | ORAL_TABLET | Freq: Every evening | ORAL | 0 refills | Status: AC | PRN
  Filled 2024-10-13: qty 60, 30d supply, fill #0

## 2024-10-13 MED ORDER — FLUOXETINE HCL 20 MG PO CAPS
ORAL_CAPSULE | ORAL | 0 refills | Status: AC
  Filled 2024-10-13: qty 60, 37d supply, fill #0

## 2024-10-15 ENCOUNTER — Other Ambulatory Visit (HOSPITAL_COMMUNITY): Payer: Self-pay

## 2024-10-22 ENCOUNTER — Emergency Department (HOSPITAL_COMMUNITY)

## 2024-10-22 ENCOUNTER — Other Ambulatory Visit: Payer: Self-pay

## 2024-10-22 ENCOUNTER — Emergency Department (HOSPITAL_COMMUNITY)
Admission: EM | Admit: 2024-10-22 | Discharge: 2024-10-22 | Disposition: A | Discharge status: Home / Self Care | Attending: Emergency Medicine | Admitting: Emergency Medicine

## 2024-10-22 ENCOUNTER — Encounter (HOSPITAL_COMMUNITY): Payer: Self-pay

## 2024-10-22 DIAGNOSIS — R456 Violent behavior: Secondary | ICD-10-CM | POA: Insufficient documentation

## 2024-10-22 DIAGNOSIS — Y909 Presence of alcohol in blood, level not specified: Secondary | ICD-10-CM | POA: Insufficient documentation

## 2024-10-22 DIAGNOSIS — F10929 Alcohol use, unspecified with intoxication, unspecified: Secondary | ICD-10-CM

## 2024-10-22 DIAGNOSIS — S199XXA Unspecified injury of neck, initial encounter: Secondary | ICD-10-CM | POA: Diagnosis not present

## 2024-10-22 DIAGNOSIS — W08XXXA Fall from other furniture, initial encounter: Secondary | ICD-10-CM | POA: Insufficient documentation

## 2024-10-22 DIAGNOSIS — R4689 Other symptoms and signs involving appearance and behavior: Secondary | ICD-10-CM

## 2024-10-22 DIAGNOSIS — S0990XA Unspecified injury of head, initial encounter: Secondary | ICD-10-CM | POA: Diagnosis not present

## 2024-10-22 DIAGNOSIS — F10129 Alcohol abuse with intoxication, unspecified: Secondary | ICD-10-CM | POA: Insufficient documentation

## 2024-10-22 DIAGNOSIS — W19XXXA Unspecified fall, initial encounter: Secondary | ICD-10-CM

## 2024-10-22 MED ORDER — HALOPERIDOL LACTATE 5 MG/ML IJ SOLN
2.0000 mg | INTRAMUSCULAR | Status: DC
  Filled 2024-10-22: qty 1

## 2024-10-22 NOTE — ED Notes (Signed)
 Despite redirection, patient continues to remove C-collar. Educated importance of c-collar placement until scans are done for safety reasons. EDP at bedside.

## 2024-10-22 NOTE — ED Provider Notes (Signed)
 [facility] AT [facility] Provider Note   CSN: [id number] Arrival date & time: 10/22/24  0155     Patient presents with: Fall   Erica Collins is a 23 y.o. female.   The history is provided by the EMS personnel.  Fall This is a new problem. The current episode started less than 1 hour ago. The problem occurs constantly. The problem has been resolved. Pertinent negatives include no chest pain. Nothing aggravates the symptoms. Nothing relieves the symptoms. She has tried nothing for the symptoms. The treatment provided no relief.  BIB ems for a fall off a bar stool at a bar while drinking.  Struck back of the head.       Prior to Admission medications   Medication Sig Start Date End Date Taking? Authorizing Provider  albuterol  (PROVENTIL ) (2.5 MG/3ML) 0.083% nebulizer solution Take 3 mLs (2.5 mg total) by nebulization every 6 (six) hours as needed for wheezing or shortness of breath. 08/01/21   Horton, Kristie M, DO  albuterol  (PROVENTIL ) (5 MG/ML) 0.5% nebulizer solution Take 0.5 mLs (2.5 mg total) by nebulization every 6 (six) hours as needed for wheezing or shortness of breath. 08/21/21   Armenta Canning, MD  albuterol  (VENTOLIN  HFA) [phone] Base) MCG/ACT inhaler Inhale 2 puffs into the lungs every 4 (four) hours as needed for wheezing or shortness of breath. 03/14/24   Christopher Savannah, PA-C  amitriptyline  (ELAVIL ) 50 MG tablet Take 1/2-1 tablets (25-50 mg total) by mouth at bedtime if needed. 02/10/24     Budesonide  (PULMICORT  FLEXHALER) 90 MCG/ACT inhaler Inhale 2 puffs into the lungs 2 (two) times daily. 08/21/21   Armenta Canning, MD  busPIRone  (BUSPAR ) 15 MG tablet Take 1 tablet by mouth 2 times daily. 03/30/24     FLUoxetine  (PROZAC ) 20 MG capsule Take 1 capsule (20 mg total) by mouth in the morning for 14 days, THEN 2 capsules (40 mg total) in the morning. 10/13/24 11/26/24    FLUoxetine  (PROZAC ) 40 MG capsule Take 1 Capsule by mouth every day  03/30/24     hydrOXYzine  (ATARAX ) 10 MG tablet Take 1-2 tablets (10-20 mg total) by mouth at bedtime as needed for insomnia. 02/10/24     hydrOXYzine  (ATARAX ) 10 MG tablet Take 1-2 tablets (10-20 mg total) by mouth at bedtime for insomnia 03/30/24     hydrOXYzine  (ATARAX ) 10 MG tablet Take 1-2 tablets (10-20 mg total) by mouth at bedtime as needed for insomnia. 10/13/24     metroNIDAZOLE  (FLAGYL ) 500 MG tablet Take 1 tablet (500 mg total) by mouth 2 (two) times daily for 7 days. Avoid alcohol  intake during treatment & for 72 hours after last dose. 08/18/24     montelukast  (SINGULAIR ) 10 MG tablet Take 1 tablet (10 mg total) by mouth daily. 02/10/24     ondansetron  (ZOFRAN ) 4 MG tablet Take 1 tablet (4 mg total) by mouth every 8 (eight) hours as needed. 02/04/19   Caccavale, Sophia, PA-C  prazosin  (MINIPRESS ) 2 MG capsule Take 1 capsule (2 mg total) by mouth every night for nightmares. 02/10/24     predniSONE  (DELTASONE ) 20 MG tablet Take 2 tablets daily with breakfast. 03/14/24   Christopher Savannah, PA-C    Allergies: Patient has no known allergies.    Review of Systems  Cardiovascular:  Negative for chest pain.  Gastrointestinal:  Negative for vomiting.  Skin:  Negative for wound.  Neurological:  Negative for syncope and speech difficulty.    Updated Vital Signs BP  138/86   Pulse (!) 115   Temp 98.2 F (36.8 C) (Oral)   Resp 17   Ht 5' 6 (1.676 m)   Wt 77.1 kg   SpO2 98%   BMI 27.43 kg/m   Physical Exam Vitals and nursing note reviewed. Exam conducted with a chaperone present.  Constitutional:      General: She is not in acute distress.    Appearance: Normal appearance. She is well-developed.  HENT:     Head: Normocephalic and atraumatic.     Right Ear: Tympanic membrane normal.     Left Ear: Tympanic membrane normal.     Nose: Nose normal.     Mouth/Throat:     Mouth: Mucous membranes are moist.     Pharynx: Oropharynx is clear.  Eyes:     Pupils: Pupils are equal, round, and  reactive to light.  Neck:     Comments: C collar applied by me trachea is midline, no T or L spine tenderness  Cardiovascular:     Rate and Rhythm: Normal rate and regular rhythm.     Pulses: Normal pulses.     Heart sounds: Normal heart sounds.  Pulmonary:     Effort: Pulmonary effort is normal. No respiratory distress.     Breath sounds: Normal breath sounds.  Abdominal:     General: Bowel sounds are normal. There is no distension.     Palpations: Abdomen is soft.     Tenderness: There is no abdominal tenderness. There is no guarding or rebound.  Musculoskeletal:        General: Normal range of motion.     Right wrist: Normal. No snuff box tenderness.     Left wrist: Normal. No snuff box tenderness.     Right hand: Normal. No swelling or deformity. Normal range of motion. Normal capillary refill. Normal pulse.     Left hand: Normal. No swelling or deformity. Normal range of motion. Normal capillary refill. Normal pulse.     Cervical back: Neck supple.     Right hip: Normal.     Left hip: Normal.     Right ankle: Normal.     Right Achilles Tendon: Normal.     Left ankle: Normal.     Left Achilles Tendon: Normal.     Right foot: No swelling or deformity. Normal pulse.     Left foot: No swelling or deformity. Normal pulse.  Skin:    General: Skin is warm and dry.     Capillary Refill: Capillary refill takes less than 2 seconds.     Findings: No erythema or rash.  Neurological:     General: No focal deficit present.     Mental Status: She is alert.     Deep Tendon Reflexes: Reflexes normal.  Psychiatric:        Mood and Affect: Affect is angry.        Behavior: Behavior is aggressive.     (all labs ordered are listed, but only abnormal results are displayed) Labs Reviewed - No data to display  EKG: None  Radiology: CT Head Wo Contrast Result Date: 10/22/2024 EXAM: CT HEAD AND CERVICAL SPINE 10/22/2024 03:12:00 AM TECHNIQUE: CT of the head and cervical spine was  performed without the administration of intravenous contrast. Multiplanar reformatted images are provided for review. Automated exposure control, iterative reconstruction, and/or weight based adjustment of the mA/kV was utilized to reduce the radiation dose to as low as reasonably achievable. COMPARISON: None available. CLINICAL HISTORY:  Polytrauma, blunt FINDINGS: CT HEAD BRAIN AND VENTRICLES: No acute intracranial hemorrhage. No mass effect or midline shift. No abnormal extra-axial fluid collection. No evidence of acute infarct. No hydrocephalus. ORBITS: No acute abnormality. SINUSES AND MASTOIDS: No acute abnormality. SOFT TISSUES AND SKULL: No acute skull fracture. No acute soft tissue abnormality. CT CERVICAL SPINE BONES AND ALIGNMENT: No acute fracture or traumatic malalignment. DEGENERATIVE CHANGES: No significant degenerative changes. SOFT TISSUES: No prevertebral soft tissue swelling. IMPRESSION: 1. No acute abnormality intracranially or in the cervical spine. Electronically signed by: Gilmore Molt MD 10/22/2024 03:39 AM EST RP Workstation: [id number]   CT Cervical Spine Wo Contrast Result Date: 10/22/2024 EXAM: CT HEAD AND CERVICAL SPINE 10/22/2024 03:12:00 AM TECHNIQUE: CT of the head and cervical spine was performed without the administration of intravenous contrast. Multiplanar reformatted images are provided for review. Automated exposure control, iterative reconstruction, and/or weight based adjustment of the mA/kV was utilized to reduce the radiation dose to as low as reasonably achievable. COMPARISON: None available. CLINICAL HISTORY: Polytrauma, blunt FINDINGS: CT HEAD BRAIN AND VENTRICLES: No acute intracranial hemorrhage. No mass effect or midline shift. No abnormal extra-axial fluid collection. No evidence of acute infarct. No hydrocephalus. ORBITS: No acute abnormality. SINUSES AND MASTOIDS: No acute abnormality. SOFT TISSUES AND SKULL: No acute skull fracture. No acute soft tissue  abnormality. CT CERVICAL SPINE BONES AND ALIGNMENT: No acute fracture or traumatic malalignment. DEGENERATIVE CHANGES: No significant degenerative changes. SOFT TISSUES: No prevertebral soft tissue swelling. IMPRESSION: 1. No acute abnormality intracranially or in the cervical spine. Electronically signed by: Gilmore Molt MD 10/22/2024 03:39 AM EST RP Workstation: [id number]     Procedures   Medications Ordered in the ED  haloperidol  lactate (HALDOL ) injection 2 mg (has no administration in time range)                                    Medical Decision Making Patient fell off a bar stool at a bar   Amount and/or Complexity of Data Reviewed Independent Historian: EMS    Details: See above  External Data Reviewed: notes.    Details: Previous notes reviewed  Radiology: ordered and independent interpretation performed.    Details: Negative head CT  Risk Prescription drug management. Risk Details: CT ordered prior to evaluation.  C collar placed and then replaced by me.  Patient is now sleeping.  CTs normal.  Parents here to taken patient home.  No internal trauma.  Stable for discharge.     Final diagnoses:  Fall, initial encounter  Alcoholic intoxication with complication  Aggressive behavior   No signs of systemic illness or infection. The patient is nontoxic-appearing on exam and vital signs are within normal limits.  I have reviewed the triage vital signs and the nursing notes. Pertinent labs & imaging results that were available during my care of the patient were reviewed by me and considered in my medical decision making (see chart for details). After history, exam, and medical workup I feel the patient has been appropriately medically screened and is safe for discharge home. Pertinent diagnoses were discussed with the patient. Patient was given return precautions.  ED Discharge Orders     None          Marializ Ferrebee, MD 10/22/24 [phone]

## 2024-10-22 NOTE — ED Notes (Signed)
 Patient yelling profanities at staff, EDP returned to bedside. Patient yelling for us  to allow her to leave with her mother, continued to redirect that her mother is on the way and she was just speaking to her. Attempting to have patient speak with mother if it would help keep her calm but patient is refusing at this time.

## 2024-10-22 NOTE — ED Provider Notes (Signed)
 Patient yelling and calling staff names.  Grabbed and squeezed my hand as I was trying to apply her C collar that she had removed.  Patient yelling at EDP.  I asked patient to stopped squeezing me as this is not appropriate behavior for an adult.  Nurse witnessed.     Josseline Reddin, MD 10/22/24 [phone]

## 2024-10-22 NOTE — ED Notes (Signed)
 PT axox4. GCS 15. Pt and mother of pt verbalize understanding of discharge instructions and follow up. Pt offered zofran  ODT prior to discharge, but refused.  Dr. Nettie made aware. PT escorted out via wheelchair by primary RN to transportation home with mother

## 2024-10-22 NOTE — ED Triage Notes (Signed)
 Pt BIB [facility] from bar after falling off a bar stool. ETOH use. Pt the hit back of head, no obvious trauma. Pt initially only responsive to painful stimuli, per EMS. Pt now alert and verbally aggressive.  91 RA- pt was put on oxygen CBG 89 93 HR BP 138/84

## 2024-10-22 NOTE — ED Notes (Signed)
 PT continued to yell and curse and demand being let go.  Mother of patient, Sonny, on phone entire encounter.  Mother made aware of situation and agrees with MD and primary RN's plan. Haldol  prepared, pt began crying excessively while security at bedside as well.  Primary RN made patient aware and all involved in her care that she will be awarded one more opportunity to remain in bed without fighting staff, will hold on Haldol  for now.  PT nodded in agreement.

## 2024-10-22 NOTE — ED Notes (Signed)
 Patient is on the phone with mother who is reassuring patient that she is on the way.

## 2024-10-22 NOTE — ED Notes (Signed)
 Patient transported to CT

## 2024-11-11 ENCOUNTER — Other Ambulatory Visit (HOSPITAL_COMMUNITY): Payer: Self-pay

## 2024-11-11 ENCOUNTER — Other Ambulatory Visit: Payer: Self-pay

## 2024-11-11 DIAGNOSIS — F411 Generalized anxiety disorder: Secondary | ICD-10-CM | POA: Diagnosis not present

## 2024-11-11 DIAGNOSIS — F332 Major depressive disorder, recurrent severe without psychotic features: Secondary | ICD-10-CM | POA: Diagnosis not present

## 2024-11-11 DIAGNOSIS — F4310 Post-traumatic stress disorder, unspecified: Secondary | ICD-10-CM | POA: Diagnosis not present

## 2024-11-11 DIAGNOSIS — F1020 Alcohol dependence, uncomplicated: Secondary | ICD-10-CM | POA: Diagnosis not present

## 2024-11-11 MED ORDER — FLUOXETINE HCL 40 MG PO CAPS
40.0000 mg | ORAL_CAPSULE | Freq: Every morning | ORAL | 0 refills | Status: AC
  Filled 2024-11-11: qty 30, 30d supply, fill #0

## 2024-11-11 MED ORDER — PRAZOSIN HCL 1 MG PO CAPS
1.0000 mg | ORAL_CAPSULE | Freq: Every evening | ORAL | 0 refills | Status: AC
  Filled 2024-11-11: qty 30, 30d supply, fill #0

## 2024-11-22 ENCOUNTER — Other Ambulatory Visit (HOSPITAL_COMMUNITY): Payer: Self-pay

## 2024-11-23 DIAGNOSIS — F429 Obsessive-compulsive disorder, unspecified: Secondary | ICD-10-CM | POA: Diagnosis not present

## 2024-11-23 DIAGNOSIS — F411 Generalized anxiety disorder: Secondary | ICD-10-CM | POA: Diagnosis not present

## 2024-11-23 DIAGNOSIS — F1020 Alcohol dependence, uncomplicated: Secondary | ICD-10-CM | POA: Diagnosis not present

## 2024-11-23 DIAGNOSIS — Z79899 Other long term (current) drug therapy: Secondary | ICD-10-CM | POA: Diagnosis not present
